# Patient Record
Sex: Female | Born: 1965
Health system: Southern US, Community
[De-identification: ages and names within clinical notes are randomized; demographics above are authoritative.]

## PROBLEM LIST (undated history)

## (undated) DIAGNOSIS — D241 Benign neoplasm of right breast: Secondary | ICD-10-CM

## (undated) DIAGNOSIS — B019 Varicella without complication: Secondary | ICD-10-CM

## (undated) DIAGNOSIS — K219 Gastro-esophageal reflux disease without esophagitis: Secondary | ICD-10-CM

## (undated) DIAGNOSIS — D649 Anemia, unspecified: Secondary | ICD-10-CM

## (undated) DIAGNOSIS — T7840XA Allergy, unspecified, initial encounter: Secondary | ICD-10-CM

## (undated) DIAGNOSIS — R7611 Nonspecific reaction to tuberculin skin test without active tuberculosis: Secondary | ICD-10-CM

## (undated) HISTORY — DX: Nonspecific reaction to tuberculin skin test without active tuberculosis: R76.11

## (undated) HISTORY — DX: Varicella without complication: B01.9

## (undated) HISTORY — DX: Anemia, unspecified: D64.9

## (undated) HISTORY — DX: Benign neoplasm of right breast: D24.1

## (undated) HISTORY — DX: Gastro-esophageal reflux disease without esophagitis: K21.9

## (undated) HISTORY — PX: WISDOM TOOTH EXTRACTION: SHX21

## (undated) HISTORY — DX: Allergy, unspecified, initial encounter: T78.40XA

---

## 1990-05-26 HISTORY — PX: CHOLECYSTECTOMY: SHX55

## 1998-08-22 ENCOUNTER — Other Ambulatory Visit: Admission: RE | Admit: 1998-08-22 | Discharge: 1998-08-22 | Payer: Self-pay | Admitting: Obstetrics and Gynecology

## 1999-08-27 ENCOUNTER — Other Ambulatory Visit: Admission: RE | Admit: 1999-08-27 | Discharge: 1999-08-27 | Payer: Self-pay | Admitting: Obstetrics and Gynecology

## 2000-09-22 ENCOUNTER — Other Ambulatory Visit: Admission: RE | Admit: 2000-09-22 | Discharge: 2000-09-22 | Payer: Self-pay | Admitting: Obstetrics and Gynecology

## 2005-12-16 ENCOUNTER — Other Ambulatory Visit: Admission: RE | Admit: 2005-12-16 | Discharge: 2005-12-16 | Payer: Self-pay | Admitting: Obstetrics and Gynecology

## 2008-11-14 ENCOUNTER — Encounter: Admission: RE | Admit: 2008-11-14 | Discharge: 2008-11-14 | Payer: Self-pay | Admitting: Internal Medicine

## 2008-11-17 ENCOUNTER — Encounter: Admission: RE | Admit: 2008-11-17 | Discharge: 2008-11-17 | Payer: Self-pay | Admitting: Internal Medicine

## 2010-02-27 ENCOUNTER — Encounter: Admission: RE | Admit: 2010-02-27 | Discharge: 2010-02-27 | Payer: Self-pay | Admitting: Obstetrics and Gynecology

## 2011-07-10 ENCOUNTER — Other Ambulatory Visit: Payer: Self-pay | Admitting: Obstetrics and Gynecology

## 2011-07-10 DIAGNOSIS — Z1231 Encounter for screening mammogram for malignant neoplasm of breast: Secondary | ICD-10-CM

## 2011-07-15 ENCOUNTER — Ambulatory Visit: Payer: Self-pay

## 2011-08-01 ENCOUNTER — Ambulatory Visit: Payer: Self-pay | Admitting: Obstetrics and Gynecology

## 2011-08-12 ENCOUNTER — Ambulatory Visit
Admission: RE | Admit: 2011-08-12 | Discharge: 2011-08-12 | Disposition: A | Discharge status: Home / Self Care | Payer: BC Managed Care – PPO | Source: Ambulatory Visit | Attending: Obstetrics and Gynecology | Admitting: Obstetrics and Gynecology

## 2011-08-12 DIAGNOSIS — Z1231 Encounter for screening mammogram for malignant neoplasm of breast: Secondary | ICD-10-CM

## 2011-08-13 ENCOUNTER — Other Ambulatory Visit: Payer: Self-pay | Admitting: Obstetrics and Gynecology

## 2011-08-13 DIAGNOSIS — R928 Other abnormal and inconclusive findings on diagnostic imaging of breast: Secondary | ICD-10-CM

## 2011-08-25 DIAGNOSIS — D241 Benign neoplasm of right breast: Secondary | ICD-10-CM

## 2011-08-25 HISTORY — PX: BREAST BIOPSY: SHX20

## 2011-08-25 HISTORY — DX: Benign neoplasm of right breast: D24.1

## 2011-09-08 ENCOUNTER — Ambulatory Visit
Admission: RE | Admit: 2011-09-08 | Discharge: 2011-09-08 | Disposition: A | Discharge status: Home / Self Care | Payer: BC Managed Care – PPO | Source: Ambulatory Visit | Attending: Obstetrics and Gynecology | Admitting: Obstetrics and Gynecology

## 2011-09-08 ENCOUNTER — Other Ambulatory Visit: Payer: Self-pay | Admitting: Obstetrics and Gynecology

## 2011-09-08 DIAGNOSIS — R928 Other abnormal and inconclusive findings on diagnostic imaging of breast: Secondary | ICD-10-CM

## 2011-09-17 ENCOUNTER — Ambulatory Visit
Admission: RE | Admit: 2011-09-17 | Discharge: 2011-09-17 | Disposition: A | Discharge status: Home / Self Care | Payer: BC Managed Care – PPO | Source: Ambulatory Visit | Attending: Obstetrics and Gynecology | Admitting: Obstetrics and Gynecology

## 2011-09-17 DIAGNOSIS — R928 Other abnormal and inconclusive findings on diagnostic imaging of breast: Secondary | ICD-10-CM

## 2011-09-19 ENCOUNTER — Ambulatory Visit: Payer: Self-pay | Admitting: Obstetrics and Gynecology

## 2011-09-23 ENCOUNTER — Ambulatory Visit: Payer: Self-pay | Admitting: Obstetrics and Gynecology

## 2011-10-10 ENCOUNTER — Encounter: Payer: Self-pay | Admitting: Obstetrics and Gynecology

## 2011-10-10 ENCOUNTER — Ambulatory Visit: Payer: Self-pay | Admitting: Obstetrics and Gynecology

## 2011-10-10 ENCOUNTER — Ambulatory Visit (INDEPENDENT_AMBULATORY_CARE_PROVIDER_SITE_OTHER): Payer: BC Managed Care – PPO | Admitting: Obstetrics and Gynecology

## 2011-10-10 VITALS — BP 120/82 | Temp 98.3°F | Ht 64.0 in | Wt 194.0 lb

## 2011-10-10 DIAGNOSIS — Z9049 Acquired absence of other specified parts of digestive tract: Secondary | ICD-10-CM | POA: Insufficient documentation

## 2011-10-10 DIAGNOSIS — Z01419 Encounter for gynecological examination (general) (routine) without abnormal findings: Secondary | ICD-10-CM

## 2011-10-10 DIAGNOSIS — D242 Benign neoplasm of left breast: Secondary | ICD-10-CM | POA: Insufficient documentation

## 2011-10-10 DIAGNOSIS — Z124 Encounter for screening for malignant neoplasm of cervix: Secondary | ICD-10-CM

## 2011-10-10 DIAGNOSIS — Z9089 Acquired absence of other organs: Secondary | ICD-10-CM

## 2011-10-10 DIAGNOSIS — N6029 Fibroadenosis of unspecified breast: Secondary | ICD-10-CM

## 2011-10-10 NOTE — Progress Notes (Signed)
Subjective:    Sara Mercer is a 46 y.o. female, G1P0010, who presents for an annual exam. The patient reports no complaints. 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  Menstrual cycle:   LMP: Patient's last menstrual period was 10/02/2011. flow 3 days pad change  3/day mild cramping managed by Ibuprofen             Review of Systems Pertinent items are noted in HPI. Denies pelvic pain, uti symptoms, vaginitis symptoms, irregular bleeding, menopausal symptoms, change in bowel habits or rectal bleeding   Objective:    BP 120/82  Temp(Src) 98.3 F (36.8 C) (Oral)  Ht 5\' 4"  (1.626 m)  Wt 194 lb (87.998 kg)  BMI 33.30 kg/m2  LMP 10/02/2011 Wt Readings from Last 1 Encounters:  10/10/11 194 lb (87.998 kg)   Body mass index is 33.30 kg/(m^2).  General Appearance: Alert, appropriate appearance for age. No acute distress HEENT: Grossly normal Neck / Thyroid: Supple, no thyromegaly or cervical adenopathy Lungs: clear to auscultation bilaterally Back: No CVA tenderness Breast Exam: No masses or nodes.No dimpling, nipple retraction or discharge. Cardiovascular: Regular rate and rhythm.  Gastrointestinal: Soft, non-tender, no masses or organomegaly Pelvic Exam: EGBUS-wnl, vagina-normal rugae, cervix- without lesions or tenderness, uterus appears normal size shape and consistency, adnexae-no masses or tenderness Lymphatic Exam: Non-palpable nodes in neck, clavicular,  axillary, or inguinal regions  Skin: no rashes or abnormalities Extremities: no clubbing cyanosis or edema  Neurologic: grossly normal Psychiatric: Alert and oriented     Assessment:   Routine GYN Exam Left Breast Fibroadenoma (new diagnosis)   Plan:    PAP sent RTO 1 year or prn  Ruthene Methvin,ELMIRAPA-C

## 2011-10-10 NOTE — Progress Notes (Signed)
Regular Periods: yes Mammogram: yes  Monthly Breast Ex.: no Exercise: no  Tetanus < 10 years: yes Seatbelts: yes  NI. Bladder Functn.: yes Abuse at home: no  Daily BM's: yes Stressful Work: yes  Healthy Diet: yes Sigmoid-Colonoscopy: no  Calcium: yes Medical problems this year: no problems   LAST PAP:2011  nl  Contraception: none  Mammogram:  08/2011 abnl.;bx done on right side;normal  PCP: DR. Andi Devon  PMH: NO CHANGE  FMH: NO CHANGE  Last Bone Scan: NO

## 2011-10-13 ENCOUNTER — Ambulatory Visit: Payer: Self-pay | Admitting: Obstetrics and Gynecology

## 2011-10-15 LAB — PAP IG W/ RFLX HPV ASCU

## 2013-03-25 ENCOUNTER — Other Ambulatory Visit: Payer: Self-pay

## 2013-03-25 DIAGNOSIS — Z1231 Encounter for screening mammogram for malignant neoplasm of breast: Secondary | ICD-10-CM

## 2013-05-02 ENCOUNTER — Ambulatory Visit
Admission: RE | Admit: 2013-05-02 | Discharge: 2013-05-02 | Disposition: A | Discharge status: Home / Self Care | Payer: 59 | Source: Ambulatory Visit

## 2013-05-02 DIAGNOSIS — Z1231 Encounter for screening mammogram for malignant neoplasm of breast: Secondary | ICD-10-CM

## 2014-03-27 ENCOUNTER — Encounter: Payer: Self-pay | Admitting: Obstetrics and Gynecology

## 2014-05-08 ENCOUNTER — Other Ambulatory Visit: Payer: Self-pay

## 2014-05-08 DIAGNOSIS — Z1231 Encounter for screening mammogram for malignant neoplasm of breast: Secondary | ICD-10-CM

## 2014-05-16 ENCOUNTER — Ambulatory Visit
Admission: RE | Admit: 2014-05-16 | Discharge: 2014-05-16 | Disposition: A | Discharge status: Home / Self Care | Payer: 59 | Source: Ambulatory Visit

## 2014-05-16 DIAGNOSIS — Z1231 Encounter for screening mammogram for malignant neoplasm of breast: Secondary | ICD-10-CM

## 2014-10-23 ENCOUNTER — Ambulatory Visit: Payer: 59

## 2014-10-23 ENCOUNTER — Encounter: Payer: Self-pay | Admitting: Emergency Medicine

## 2014-10-23 ENCOUNTER — Ambulatory Visit
Admission: EM | Admit: 2014-10-23 | Discharge: 2014-10-23 | Disposition: A | Discharge status: Home / Self Care | Payer: 59 | Attending: Family Medicine | Admitting: Family Medicine

## 2014-10-23 DIAGNOSIS — Z79899 Other long term (current) drug therapy: Secondary | ICD-10-CM | POA: Diagnosis not present

## 2014-10-23 DIAGNOSIS — D649 Anemia, unspecified: Secondary | ICD-10-CM | POA: Insufficient documentation

## 2014-10-23 DIAGNOSIS — S93432A Sprain of tibiofibular ligament of left ankle, initial encounter: Secondary | ICD-10-CM

## 2014-10-23 DIAGNOSIS — S93492A Sprain of other ligament of left ankle, initial encounter: Secondary | ICD-10-CM

## 2014-10-23 DIAGNOSIS — Z7982 Long term (current) use of aspirin: Secondary | ICD-10-CM | POA: Insufficient documentation

## 2014-10-23 DIAGNOSIS — Z9049 Acquired absence of other specified parts of digestive tract: Secondary | ICD-10-CM | POA: Insufficient documentation

## 2014-10-23 DIAGNOSIS — M25572 Pain in left ankle and joints of left foot: Secondary | ICD-10-CM | POA: Insufficient documentation

## 2014-10-23 MED ORDER — MELOXICAM 15 MG PO TABS: 15.0000 mg | ORAL_TABLET | Freq: Every day | ORAL | Status: DC

## 2014-10-23 NOTE — ED Notes (Signed)
Patient states that she tripped and fell down her garage stairs 4 weeks ago and is continues to have pain and swelling in her left ankle.

## 2014-10-23 NOTE — ED Provider Notes (Signed)
CSN: [id number]     Arrival date & time 10/23/14  1032 History   First MD Initiated Contact with Patient 10/23/14 1200     Chief Complaint  Patient presents with  . Ankle Pain    left   (Consider location/radiation/quality/duration/timing/severity/associated sxs/prior Treatment) Patient is a 49 y.o. female presenting with ankle pain. The history is provided by the patient. No language interpreter was used.  Ankle Pain Location:  Ankle Time since incident:  2 weeks Injury: yes   Mechanism of injury: fall   Fall:    Fall occurred:  Down stairs   Impact surface:  Unable to specify Ankle location:  L ankle Pain details:    Quality:  Aching and throbbing   Radiates to:  Does not radiate   Duration:  2 weeks   Progression:  Waxing and waning Chronicity:  New Dislocation: no   Foreign body present:  No foreign bodies Prior injury to area:  No Relieved by:  Nothing Worsened by:  Activity Ineffective treatments:  NSAIDs Risk factors: no concern for non-accidental trauma, no frequent fractures, no known bone disorder, no obesity and no recent illness     Past Medical History  Diagnosis Date  . Anemia   . Thalassemia minor   . 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 Cholecystectomy  1992  . Wisdom tooth extraction    . Breast biopsy  08/2011    negative results   Family History  Problem Relation Age of Onset  . Hypertension Maternal Grandmother   . Heart disease Maternal Grandmother     heart failure  . Glaucoma Maternal Grandfather   . Diabetes Maternal Grandfather   . Hypertension Maternal Grandfather   . Hypertension Father   . Diabetes Mother   . Glaucoma Mother    History  Substance Use Topics  . Smoking status: Never Smoker   . Smokeless tobacco: Never Used  . Alcohol Use: No   OB History    Gravida Para Term Preterm AB TAB SAB Ectopic Multiple Living   1 0   1  1   0     Review of  Systems  All other systems reviewed and are negative.   Allergies  Review of patient's allergies indicates no known allergies.  Home Medications   Prior to Admission medications   Medication Sig Start Date End Date Taking? Authorizing Provider  aspirin 81 MG tablet Take 81 mg by mouth daily.    Historical Provider, MD  Biotin 10 MG TABS Take by mouth.    Historical Provider, MD  calcium carbonate 200 MG capsule Take 250 mg by mouth 2 (two) times daily with a meal.    Historical Provider, MD  cholecalciferol (VITAMIN D) 1000 UNITS tablet Take 1,000 Units by mouth daily.    Historical Provider, MD  Famotidine (PEPCID PO) Take by mouth.    Historical Provider, MD  fexofenadine (ALLEGRA) 180 MG tablet Take 180 mg by mouth daily.    Historical Provider, MD  fish oil-omega-3 fatty acids 1000 MG capsule Take 2 g by mouth daily.    Historical Provider, MD  fluticasone (FLONASE) 50 MCG/ACT nasal spray Place 2 sprays into the nose daily.    Historical Provider, MD  Multiple Vitamin (MULTIVITAMIN) tablet Take 1 tablet by mouth daily.    Historical Provider, MD  niacin 100 MG tablet Take 100 mg by mouth daily with breakfast.  Historical Provider, MD  vitamin E 100 UNIT capsule Take 100 Units by mouth daily.    Historical Provider, MD   BP 116/60 mmHg  Pulse 58  Temp(Src) 97.8 F (36.6 C) (Tympanic)  Resp 16  Ht 5\' 4"  (1.626 m)  Wt 200 lb (90.719 kg)  BMI 34.31 kg/m2  SpO2 100%  LMP 10/22/2014 (Exact Date) Physical Exam  Constitutional: She appears well-developed and well-nourished. No distress.  HENT:  Head: Normocephalic.  Musculoskeletal: She exhibits edema and tenderness.       Left foot: There is decreased range of motion, tenderness and swelling.       Feet:  Neurological: She is alert.  Skin: Skin is warm and dry. She is not diaphoretic.  Psychiatric: She has a normal mood and affect.    ED Course  Procedures (including critical care time) Labs Review Labs Reviewed - No  data to display  Imaging Review Dg Ankle Complete Left  10/23/2014   CLINICAL DATA:  Week old injury.  Persistent left ankle pain.  EXAM: LEFT ANKLE COMPLETE - 3+ VIEW  COMPARISON:  None.  FINDINGS: No fracture. Ankle mortise normally spaced and aligned. No arthropathic change. There is no bone lesion.  Mild soft tissue swelling is noted diffusely.  IMPRESSION: 1. No fracture or ankle joint abnormality. Mild nonspecific soft tissue edema. Given the persistence of left ankle pain, significant ligamentous injury should be considered which would be best evaluated with left ankle MRI.   Electronically Signed   By: Lajean Manes M.D.   On: 10/23/2014 12:19   I discussed with patient that we will try her with a boot for about 2 weeks. If things are not improved at that time she will probably need to see orthopedic for possible MRI. Also said Motrin will try Mobic 15 mg 1 tablet a day.   MDM  No diagnosis found.     Frederich Cha, MD 10/25/14 [phone]

## 2014-10-23 NOTE — Discharge Instructions (Signed)

## 2015-04-24 ENCOUNTER — Other Ambulatory Visit: Payer: Self-pay

## 2015-04-24 DIAGNOSIS — Z1231 Encounter for screening mammogram for malignant neoplasm of breast: Secondary | ICD-10-CM

## 2015-05-08 ENCOUNTER — Encounter: Payer: Self-pay | Admitting: Sports Medicine

## 2015-05-08 ENCOUNTER — Ambulatory Visit (INDEPENDENT_AMBULATORY_CARE_PROVIDER_SITE_OTHER): Payer: 59 | Admitting: Sports Medicine

## 2015-05-08 DIAGNOSIS — M2040 Other hammer toe(s) (acquired), unspecified foot: Secondary | ICD-10-CM | POA: Diagnosis not present

## 2015-05-08 DIAGNOSIS — B351 Tinea unguium: Secondary | ICD-10-CM | POA: Diagnosis not present

## 2015-05-08 DIAGNOSIS — M79676 Pain in unspecified toe(s): Secondary | ICD-10-CM

## 2015-05-08 DIAGNOSIS — L84 Corns and callosities: Secondary | ICD-10-CM

## 2015-05-08 NOTE — Patient Instructions (Signed)
Hammer Toes Hammer toes is a condition in which one or more of your toes is permanently flexed. CAUSES  This happens when a muscle imbalance or abnormal bone length makes your small toes buckle. This causes the toe joint to contract and the strong cord-like bands that attach muscles to the bones (tendons) in your toes to shorten.  SIGNS AND SYMPTOMS  Common symptoms of flexible hammer toes include:   A buildup of skin cells (corns). Corns occur where boney bumps come in frequent contact with hard surfaces. For example, where your shoes press and rub.  Irritation.  Inflammation.  Pain.  Limited motion in your toes. DIAGNOSIS  Hammer toes are diagnosed through a physical exam of your toes. During the exam, your health care provider may try to reproduce your symptoms by manipulating your foot. Often, X-ray exams are done to determine the degree of deformity and to make sure that the cause is not a fracture.  TREATMENT  Hammer toes can be treated with corrective surgery. There are several types of surgical procedures that can treat hammer toes. The most common procedures include:  Arthroplasty--A portion of the joint is surgically removed and your toe is straightened. The gap fills in with fibrous tissue. This procedure helps treat pain and deformity and helps restore function.  Fusion--Cartilage between the two bones of the affected joint is taken out and the bones fuse together into one longer bone. This helps keep your toe stable and reduces pain but leaves your toe stiff, yet straight.  Implantation--A portion of your bone is removed and replaced with an implant to restore motion.  Flexor tendon transfers--This procedure repositions the tendons that curl the toes down (flexor tendons). This may be done to release the deforming force that causes your toe to buckle. Several of these procedures require fixing your toe with a pin that is visible at the tip of your toe. The pin keeps the toe  straight during healing. Your health care provider will remove the pin usually within 4-8 weeks after the procedure.    This information is not intended to replace advice given to you by your health care provider. Make sure you discuss any questions you have with your health care provider.   Document Released: 05/09/2000 Document Revised: 05/17/2013 Document Reviewed: 01/17/2013 Elsevier Interactive Patient Education 2016 [employer]  

## 2015-05-08 NOTE — Progress Notes (Signed)
Patient ID: [name], female   DOB: 1965/10/26, 49 y.o.   MRN: [id number] Subjective: Sara Mercer is a 49 y.o. female patient seen today in office with complaint of painful callus in between her right 4-5 toes; states that it has been there for >6 months. Admits that it does not hurt however wanted to get it checked out because her mom scared her into thinking that it could be cancer. Patient has not tried anything to treat it. Patient is also concerned about discoloration to all toe nails; states that several of her family members have nails that look this way; admits that her uncles took oral Lamisil with resolution of the changes in their nails. Patient denies history of Diabetes, Neuropathy, or Vascular disease. Patient has no other pedal complaints at this time.   Patient Active Problem List   Diagnosis Date Noted  . Fibroadenoma of breast, left 10/10/2011  . S/P cholecystectomy 10/10/2011   Current Outpatient Prescriptions on File Prior to Visit  Medication Sig Dispense Refill  . Biotin 10 MG TABS Take by mouth.    . calcium carbonate 200 MG capsule Take 250 mg by mouth 2 (two) times daily with a meal.    . cholecalciferol (VITAMIN D) 1000 UNITS tablet Take 1,000 Units by mouth daily.    . Famotidine (PEPCID PO) Take by mouth.    . fexofenadine (ALLEGRA) 180 MG tablet Take 180 mg by mouth daily.    . fluticasone (FLONASE) 50 MCG/ACT nasal spray Place 2 sprays into the nose daily.    . Multiple Vitamin (MULTIVITAMIN) tablet Take 1 tablet by mouth daily.    . vitamin E 100 UNIT capsule Take 100 Units by mouth daily.    . meloxicam (MOBIC) 15 MG tablet Take 1 tablet (15 mg total) by mouth daily. (Patient not taking: Reported on 05/08/2015) 30 tablet 1   No current facility-administered medications on file prior to visit.   No Known Allergies     Objective: Physical Exam  General: Well developed, nourished, no acute distress, awake, alert and oriented x 3  Vascular:  Dorsalis pedis artery 2/4 bilateral, Posterior tibial artery 2/4 bilateral, skin temperature warm to warm proximal to distal bilateral lower extremities, no varicosities, pedal hair present bilateral.  Neurological: Gross sensation present via light touch bilateral.   Dermatological: Skin is warm, dry, and supple bilateral, Nails 1-10 are short thick, and discolored with mild subungal debris, no webspace macerations present bilateral, no open lesions present bilateral, + callus/hyperkeratotic tissue present right 5th toe medial aspect at webspace. No signs of infection bilateral.  Musculoskeletal: Mild asymptomatic bunion and hammertoe deformities noted bilateral. Muscular strength within normal limits without pain or limitation on range of motion. No pain with calf compression bilateral.  Assessment and Plan:  Problem List Items Addressed This Visit    None    Visit Diagnoses    Pain of toe, unspecified laterality    -  Primary    Relevant Orders    Fungus Culture with Smear    Corn or callus        Relevant Orders    Fungus Culture with Smear    Hammertoe, unspecified laterality        Relevant Orders    Fungus Culture with Smear    Dermatophytosis of nail        Relevant Orders    Fungus Culture with Smear       -Examined patient.  -Discussed treatment options for dystrophic nails  with possible fungus and interdigital callus. -Mechanically debrided callus at 4th interspace on right foot with a sterile chisel blade without incident; explained etiology and reoccurence rate secondary to hammertoe deformity. Dispensed padding and advised patient close monitoring and to consider hammertoe repair if persists. -Fungal culture obtained for all nails and sent to [facility] lab -Patient to return in 4 weeks  for follow up evaluation/discuss culture results or sooner if symptoms worsen.  Landis Martins, DPM

## 2015-05-09 ENCOUNTER — Telehealth: Payer: Self-pay | Admitting: *Deleted

## 2015-05-09 NOTE — Telephone Encounter (Signed)
Pt states she saw Dr. Cannon Kettle yesterday and was given a gel toe cushion, and she has lost it.  Pt request few to be left at the front desk for her to pick up tomorrow morning.

## 2015-05-30 ENCOUNTER — Ambulatory Visit
Admission: RE | Admit: 2015-05-30 | Discharge: 2015-05-30 | Disposition: A | Discharge status: Home / Self Care | Payer: 59 | Source: Ambulatory Visit

## 2015-05-30 ENCOUNTER — Ambulatory Visit: Payer: 59

## 2015-05-30 DIAGNOSIS — Z1231 Encounter for screening mammogram for malignant neoplasm of breast: Secondary | ICD-10-CM | POA: Diagnosis not present

## 2015-06-01 ENCOUNTER — Encounter: Payer: Self-pay | Admitting: Sports Medicine

## 2015-06-04 ENCOUNTER — Ambulatory Visit: Payer: 59 | Admitting: Internal Medicine

## 2015-06-12 ENCOUNTER — Ambulatory Visit (INDEPENDENT_AMBULATORY_CARE_PROVIDER_SITE_OTHER): Payer: 59 | Admitting: Sports Medicine

## 2015-06-12 VITALS — BP 108/54 | HR 66 | Resp 18

## 2015-06-12 DIAGNOSIS — M79676 Pain in unspecified toe(s): Secondary | ICD-10-CM | POA: Diagnosis not present

## 2015-06-12 DIAGNOSIS — L84 Corns and callosities: Secondary | ICD-10-CM | POA: Diagnosis not present

## 2015-06-12 DIAGNOSIS — M2040 Other hammer toe(s) (acquired), unspecified foot: Secondary | ICD-10-CM | POA: Diagnosis not present

## 2015-06-12 DIAGNOSIS — L603 Nail dystrophy: Secondary | ICD-10-CM | POA: Diagnosis not present

## 2015-06-12 NOTE — Progress Notes (Signed)
Patient ID: Sara Mercer, female   DOB: 08/20/1965, 50 y.o.   MRN: [id number]  Subjective: [place] Sara Mercer is a 50 y.o. female patient seen today in office for follow up evaluation of painful callus in between her right 4-5 toes and for discussion of nail culture results; states that the corn is less painful than before; states that the cushions seem to help. Patient has no other pedal complaints at this time.   Patient Active Problem List   Diagnosis Date Noted  . Fibroadenoma of breast, left 10/10/2011  . S/P cholecystectomy 10/10/2011   Current Outpatient Prescriptions on File Prior to Visit  Medication Sig Dispense Refill  . Biotin 10 MG TABS Take by mouth.    . calcium carbonate 200 MG capsule Take 250 mg by mouth 2 (two) times daily with a meal.    . cholecalciferol (VITAMIN D) 1000 UNITS tablet Take 1,000 Units by mouth daily.    . Famotidine (PEPCID PO) Take by mouth.    . fexofenadine (ALLEGRA) 180 MG tablet Take 180 mg by mouth daily.    . fluticasone (FLONASE) 50 MCG/ACT nasal spray Place 2 sprays into the nose daily.    . meloxicam (MOBIC) 15 MG tablet Take 1 tablet (15 mg total) by mouth daily. (Patient not taking: Reported on 05/08/2015) 30 tablet 1  . Multiple Vitamin (MULTIVITAMIN) tablet Take 1 tablet by mouth daily.    . vitamin E 100 UNIT capsule Take 100 Units by mouth daily.     No current facility-administered medications on file prior to visit.   No Known Allergies    Objective: Physical Exam  General: Well developed, nourished, no acute distress, awake, alert and oriented x 3  Vascular: Dorsalis pedis artery 2/4 bilateral, Posterior tibial artery 2/4 bilateral, skin temperature warm to warm proximal to distal bilateral lower extremities, no varicosities, pedal hair present bilateral.  Neurological: Gross sensation present via light touch bilateral.   Dermatological: Skin is warm, dry, and supple bilateral, Nails 1-10 are short thick, and discolored with  mild subungal debris, no webspace macerations present bilateral, no open lesions present bilateral, + callus/hyperkeratotic tissue present right 5th toe medial aspect at webspace. No signs of infection bilateral.  Musculoskeletal: Mild asymptomatic bunion and hammertoe deformities noted bilateral. Muscular strength within normal limits without pain or limitation on range of motion. No pain with calf compression bilateral.  Assessment and Plan:  Problem List Items Addressed This Visit    None    Visit Diagnoses    Pain of toe, unspecified laterality    -  Primary    Corn or callus        Hammertoe, unspecified laterality        Nail dystrophy        Negative fungal culture; Micro-trauma        -Examined patient.  -Reviewed nail culture results with patient; Negative for fungus. Will continue to monitor nails for any acute changes. Encouraged good hygiene and proper shoes to prevent microtrauma to nails -Mechanically debrided callus at 4th interspace on right foot with a sterile chisel blade without incident; re-educated patient on etiology and reoccurence rate secondary to hammertoe deformity. Dispensed padding and advised patient close monitoring and to consider hammertoe repair if persists. -Patient to return as needed or sooner if symptoms worsen.  Sara Mercer, DPM

## 2015-07-02 ENCOUNTER — Ambulatory Visit: Payer: 59 | Admitting: Internal Medicine

## 2015-07-25 ENCOUNTER — Emergency Department (HOSPITAL_COMMUNITY)
Admission: EM | Admit: 2015-07-25 | Discharge: 2015-07-25 | Disposition: A | Discharge status: Home / Self Care | Payer: 59 | Source: Home / Self Care | Attending: Family Medicine | Admitting: Family Medicine

## 2015-07-25 ENCOUNTER — Encounter (HOSPITAL_COMMUNITY): Payer: Self-pay | Admitting: *Deleted

## 2015-07-25 DIAGNOSIS — S80812A Abrasion, left lower leg, initial encounter: Secondary | ICD-10-CM | POA: Diagnosis not present

## 2015-07-25 DIAGNOSIS — W5503XA Scratched by cat, initial encounter: Secondary | ICD-10-CM

## 2015-07-25 MED ORDER — BACITRACIN ZINC 500 UNIT/GM EX OINT
TOPICAL_OINTMENT | CUTANEOUS | Status: AC
  Filled 2015-07-25: qty 1.8

## 2015-07-25 NOTE — ED Provider Notes (Signed)
CSN: [id number]     Arrival date & time 07/25/15  1301 History   First MD Initiated Contact with Patient 07/25/15 1321     Chief Complaint  Patient presents with  . Animal Bite   (Consider location/radiation/quality/duration/timing/severity/associated sxs/prior Treatment) Patient is a 50 y.o. female presenting with leg pain. The history is provided by the patient.  Leg Pain Location:  Leg Injury: yes   Mechanism of injury comment:  Cat scratch injury  to leg. Leg location:  L lower leg Pain details:    Quality:  Sharp   Severity:  No pain Chronicity:  New Dislocation: no   Foreign body present:  No foreign bodies Tetanus status:  Up to date Relieved by: local antiseptic wound care used promtly by pt. Associated symptoms: no fever, no stiffness and no swelling     Past Medical History  Diagnosis Date  . Anemia   . Thalassemia minor   . 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 Cholecystectomy  1992  . Wisdom tooth extraction    . Breast biopsy  08/2011    negative results   Family History  Problem Relation Age of Onset  . Hypertension Maternal Grandmother   . Heart disease Maternal Grandmother     heart failure  . Glaucoma Maternal Grandfather   . Diabetes Maternal Grandfather   . Hypertension Maternal Grandfather   . Hypertension Father   . Diabetes Mother   . Glaucoma Mother    Social History  Substance Use Topics  . Smoking status: Never Smoker   . Smokeless tobacco: Never Used  . Alcohol Use: No   OB History    Gravida Para Term Preterm AB TAB SAB Ectopic Multiple Living   1 0   1  1   0     Review of Systems  Constitutional: Negative for fever.  Musculoskeletal: Negative.  Negative for stiffness.  Skin: Positive for wound.    Allergies  Review of patient's allergies indicates no known allergies.  Home Medications   Prior to Admission medications   Medication Sig Start Date End  Date Taking? Authorizing Provider  Biotin 10 MG TABS Take by mouth.    Historical Provider, MD  calcium carbonate 200 MG capsule Take 250 mg by mouth 2 (two) times daily with a meal.    Historical Provider, MD  cholecalciferol (VITAMIN D) 1000 UNITS tablet Take 1,000 Units by mouth daily.    Historical Provider, MD  Famotidine (PEPCID PO) Take by mouth.    Historical Provider, MD  fexofenadine (ALLEGRA) 180 MG tablet Take 180 mg by mouth daily.    Historical Provider, MD  fluticasone (FLONASE) 50 MCG/ACT nasal spray Place 2 sprays into the nose daily.    Historical Provider, MD  meloxicam (MOBIC) 15 MG tablet Take 1 tablet (15 mg total) by mouth daily. Patient not taking: Reported on 05/08/2015 10/23/14   Frederich Cha, MD  Multiple Vitamin (MULTIVITAMIN) tablet Take 1 tablet by mouth daily.    Historical Provider, MD  vitamin E 100 UNIT capsule Take 100 Units by mouth daily.    Historical Provider, MD   Meds Ordered and Administered this Visit  Medications - No data to display  BP 133/83 mmHg  Pulse 65  Temp(Src) 98.4 F (36.9 C) (Oral)  Resp 18  SpO2 99% No data found.   Physical Exam  Constitutional: She is oriented to person, place, and time.  She appears well-developed and well-nourished. No distress.  Musculoskeletal: Normal range of motion. She exhibits no tenderness.  Neurological: She is alert and oriented to person, place, and time.  Skin: Skin is warm and dry.  4 minor scratch, nonpuncture wound marks to left post calf, nontender, no erythema, no drainage.  Nursing note and vitals reviewed.   ED Course  Procedures (including critical care time)  Labs Review Labs Reviewed - No data to display  Imaging Review No results found.   Visual Acuity Review  Right Eye Distance:   Left Eye Distance:   Bilateral Distance:    Right Eye Near:   Left Eye Near:    Bilateral Near:         MDM   1. Cat scratch of left lower leg, initial encounter        Billy Fischer, MD 07/29/15 1318

## 2015-07-25 NOTE — ED Notes (Signed)
Pt  Sustained  A  Superficial        Cat    Scratch  Possible  Bite  To  l  Lower  Leg  Yesterday    The  Patient  Cleaned  The  Affected  Area           Pt      Reports  Up  To  Date  On  Her  Tetanus  Immunization

## 2015-07-25 NOTE — Discharge Instructions (Signed)
Wash vigorously twice a day until healed. Return as needed.

## 2015-07-30 ENCOUNTER — Ambulatory Visit (INDEPENDENT_AMBULATORY_CARE_PROVIDER_SITE_OTHER): Payer: 59 | Admitting: Internal Medicine

## 2015-07-30 ENCOUNTER — Encounter: Payer: Self-pay | Admitting: Internal Medicine

## 2015-07-30 VITALS — BP 126/80 | HR 52 | Temp 98.7°F | Ht 64.0 in | Wt 196.0 lb

## 2015-07-30 DIAGNOSIS — Z833 Family history of diabetes mellitus: Secondary | ICD-10-CM

## 2015-07-30 DIAGNOSIS — D563 Thalassemia minor: Secondary | ICD-10-CM | POA: Diagnosis not present

## 2015-07-30 DIAGNOSIS — K219 Gastro-esophageal reflux disease without esophagitis: Secondary | ICD-10-CM | POA: Diagnosis not present

## 2015-07-30 DIAGNOSIS — J302 Other seasonal allergic rhinitis: Secondary | ICD-10-CM | POA: Insufficient documentation

## 2015-07-30 LAB — CBC WITH DIFFERENTIAL/PLATELET
Basophils Absolute: 0 10*3/uL (ref 0.0–0.1)
Basophils Relative: 0.3 % (ref 0.0–3.0)
EOS ABS: 0.1 10*3/uL (ref 0.0–0.7)
EOS PCT: 0.8 % (ref 0.0–5.0)
HCT: 35.5 % — ABNORMAL LOW (ref 36.0–46.0)
HEMOGLOBIN: 11.4 g/dL — AB (ref 12.0–15.0)
Lymphocytes Relative: 31.6 % (ref 12.0–46.0)
Lymphs Abs: 2.2 10*3/uL (ref 0.7–4.0)
MCHC: 32.1 g/dL (ref 30.0–36.0)
MCV: 71.2 fl — ABNORMAL LOW (ref 78.0–100.0)
MONO ABS: 0.6 10*3/uL (ref 0.1–1.0)
Monocytes Relative: 8.2 % (ref 3.0–12.0)
Neutro Abs: 4.1 10*3/uL (ref 1.4–7.7)
Neutrophils Relative %: 59.1 % (ref 43.0–77.0)
Platelets: 292 10*3/uL (ref 150.0–400.0)
RBC: 4.98 Mil/uL (ref 3.87–5.11)
RDW: 16 % — ABNORMAL HIGH (ref 11.5–15.5)
WBC: 6.9 10*3/uL (ref 4.0–10.5)

## 2015-07-30 LAB — COMPREHENSIVE METABOLIC PANEL
ALBUMIN: 4.2 g/dL (ref 3.5–5.2)
ALK PHOS: 38 U/L — AB (ref 39–117)
ALT: 19 U/L (ref 0–35)
AST: 23 U/L (ref 0–37)
BILIRUBIN TOTAL: 0.4 mg/dL (ref 0.2–1.2)
BUN: 11 mg/dL (ref 6–23)
CO2: 27 mEq/L (ref 19–32)
CREATININE: 0.82 mg/dL (ref 0.40–1.20)
Calcium: 9.5 mg/dL (ref 8.4–10.5)
Chloride: 104 mEq/L (ref 96–112)
GFR: 95.45 mL/min (ref >60.00)
Glucose, Bld: 77 mg/dL (ref 70–99)
Potassium: 4.1 mEq/L (ref 3.5–5.1)
SODIUM: 137 meq/L (ref 135–145)
TOTAL PROTEIN: 7.1 g/dL (ref 6.0–8.3)

## 2015-07-30 LAB — LIPID PANEL
Cholesterol: 206 mg/dL — ABNORMAL HIGH (ref 0–200)
HDL: 61.2 mg/dL (ref >39.00)
LDL Cholesterol: 128 mg/dL — ABNORMAL HIGH (ref 0–99)
NonHDL: 145.04
Total CHOL/HDL Ratio: 3
Triglycerides: 86 mg/dL (ref 0.0–149.0)
VLDL: 17.2 mg/dL (ref 0.0–40.0)

## 2015-07-30 LAB — HEMOGLOBIN A1C: HEMOGLOBIN A1C: 6.2 % (ref 4.6–6.5)

## 2015-07-30 MED ORDER — FEXOFENADINE HCL 180 MG PO TABS: 180.0000 mg | ORAL_TABLET | Freq: Every day | ORAL | Status: DC

## 2015-07-30 MED ORDER — FLUTICASONE PROPIONATE 50 MCG/ACT NA SUSP: 2.0000 | Freq: Every day | NASAL | Status: DC

## 2015-07-30 NOTE — Progress Notes (Signed)
HPI  Pt presents to the clinic today to establish care and for management of the conditions listed below. She is transferring care from Dr. Karlton Lemon at [facility].   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 She reports she has not had her blood counts checked in the last 2 years.  Seasonal Allergies: Worse in the spring. She takes Advertising account planner as needed with good relief.  GERD: She can not identify her triggers. Occurs intermittently. Takes Prevacid as needed with good relief.  Flu: 01/2015 Tetanus: 10/2014 Mammogram: 05/2015 Pap Smear: 05/2015 Vision Screening: yearly Dentist: biannually  Past Medical History  Diagnosis Date  . Anemia   . Thalassemia minor   . 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 Chicken pox   . Allergy   . GERD (gastroesophageal reflux disease)   . Positive TB test     Current Outpatient Prescriptions  Medication Sig Dispense Refill  . b complex vitamins tablet Take 1 tablet by mouth daily.    . Biotin 1000 MCG tablet Take 1,000 mcg by mouth daily.    . Cholecalciferol (VITAMIN D) 2000 units tablet Take 2,000 Units by mouth daily.    . fexofenadine (ALLEGRA) 180 MG tablet Take 180 mg by mouth daily.    . fluticasone (FLONASE) 50 MCG/ACT nasal spray Place 2 sprays into the nose daily.    Marland Kitchen ibuprofen (ADVIL,MOTRIN) 200 MG tablet Take 800 mg by mouth as needed.    . lansoprazole (PREVACID) 15 MG capsule Take 15 mg by mouth daily at 12 noon.    . Multiple Vitamin (MULTIVITAMIN) tablet Take 1 tablet by mouth daily.    . vitamin E 400 UNIT capsule Take 400 Units by mouth daily.     No current facility-administered medications for this visit.    No Known Allergies  Family History  Problem Relation Age of Onset  . Hypertension Maternal Grandmother   . Heart disease Maternal Grandmother     heart failure  . Glaucoma Maternal Grandfather   . Diabetes Maternal Grandfather   . Hypertension Maternal Grandfather   . Hypertension  Father   . Stroke Father   . Diabetes Mother   . Glaucoma Mother     Social History   Social History  . Marital Status: Divorced    Spouse Name: N/A  . Number of Children: N/A  . Years of Education: N/A   Occupational History  . Not on file.   Social History Main Topics  . Smoking status: Never Smoker   . Smokeless tobacco: Never Used  . Alcohol Use: 0.0 oz/week    0 Standard drinks or equivalent per week     Comment: occasional  . Drug Use: No  . Sexual Activity: Not Currently    Birth Control/ Protection: None   Other Topics Concern  . Not on file   Social History Narrative    ROS:  Constitutional: Denies fever, malaise, fatigue, headache or abrupt weight changes.  HEENT: Denies eye pain, eye redness, ear pain, ringing in the ears, wax buildup, runny nose, nasal congestion, bloody nose, or sore throat. Respiratory: Denies difficulty breathing, shortness of breath, cough or sputum production.   Cardiovascular: Denies chest pain, chest tightness, palpitations or swelling in the hands or feet.  Gastrointestinal: Denies abdominal pain, bloating, constipation, diarrhea or blood in the stool.  GU: Denies frequency, urgency, pain with urination, blood in urine, odor or discharge. Musculoskeletal: Denies decrease in range of motion, difficulty with gait,  muscle pain or joint pain and swelling.  Skin: Denies redness, rashes, lesions or ulcercations.  Neurological: Denies dizziness, difficulty with memory, difficulty with speech or problems with balance and coordination.  Psych: Denies anxiety, depression, SI/HI.  No other specific complaints in a complete review of systems (except as listed in HPI above).  PE:  BP 126/80 mmHg  Pulse 52  Temp(Src) 98.7 F (37.1 C) (Oral)  Ht 5\' 4"  (1.626 m)  Wt 196 lb (88.905 kg)  BMI 33.63 kg/m2  SpO2 99%  LMP 07/26/2015  Wt Readings from Last 3 Encounters:  10/23/14 200 lb (90.719 kg)  10/10/11 194 lb (87.998 kg)    General:  Appears her stated age, obese in NAD. Skin: Dry and intact. Cardiovascular: Normal rate and rhythm. S1,S2 noted.  No murmur, rubs or gallops noted.  Pulmonary/Chest: Normal effort and positive vesicular breath sounds. No respiratory distress. No wheezes, rales or ronchi noted.  Neurological: Alert and oriented.  Psychiatric: Mood and affect normal. Behavior is normal. Judgment and thought content normal.    Assessment and Plan:  Family history of DM:  Will check A1C and Lipid Profile today  RTC in 1 year for annual exam

## 2015-07-30 NOTE — Assessment & Plan Note (Signed)
Encouraged weight loss Continue Prevacid prn

## 2015-07-30 NOTE — Patient Instructions (Signed)
Anemia, Nonspecific Anemia is a condition in which the concentration of red blood cells or hemoglobin in the blood is below normal. Hemoglobin is a substance in red blood cells that carries oxygen to the tissues of the body. Anemia results in not enough oxygen reaching these tissues.  CAUSES  Common causes of anemia include:   Excessive bleeding. Bleeding may be internal or external. This includes excessive bleeding from periods (in women) or from the intestine.   Poor nutrition.   Chronic kidney, thyroid, and liver disease.  Bone marrow disorders that decrease red blood cell production.  Cancer and treatments for cancer.  HIV, AIDS, and their treatments.  Spleen problems that increase red blood cell destruction.  Blood disorders.  Excess destruction of red blood cells due to infection, medicines, and autoimmune disorders. SIGNS AND SYMPTOMS   Minor weakness.   Dizziness.   Headache.  Palpitations.   Shortness of breath, especially with exercise.   Paleness.  Cold sensitivity.  Indigestion.  Nausea.  Difficulty sleeping.  Difficulty concentrating. Symptoms may occur suddenly or they may develop slowly.  DIAGNOSIS  Additional blood tests are often needed. These help your health care provider determine the best treatment. Your health care provider will check your stool for blood and look for other causes of blood loss.  TREATMENT  Treatment varies depending on the cause of the anemia. Treatment can include:   Supplements of iron, vitamin B12, or folic acid.   Hormone medicines.   A blood transfusion. This may be needed if blood loss is severe.   Hospitalization. This may be needed if there is significant continual blood loss.   Dietary changes.  Spleen removal. HOME CARE INSTRUCTIONS Keep all follow-up appointments. It often takes many weeks to correct anemia, and having your health care provider check on your condition and your response to  treatment is very important. SEEK IMMEDIATE MEDICAL CARE IF:   You develop extreme weakness, shortness of breath, or chest pain.   You become dizzy or have trouble concentrating.  You develop heavy vaginal bleeding.   You develop a rash.   You have bloody or black, tarry stools.   You faint.   You vomit up blood.   You vomit repeatedly.   You have abdominal pain.  You have a fever or persistent symptoms for more than 2-3 days.   You have a fever and your symptoms suddenly get worse.   You are dehydrated.  MAKE SURE YOU:  Understand these instructions.  Will watch your condition.  Will get help right away if you are not doing well or get worse.   This information is not intended to replace advice given to you by your health care provider. Make sure you discuss any questions you have with your health care provider.   Document Released: 06/19/2004 Document Revised: 01/12/2013 Document Reviewed: 11/05/2012 Elsevier Interactive Patient Education 2016 Elsevier Inc.  

## 2015-07-30 NOTE — Progress Notes (Signed)
Pre visit review using our clinic review tool, if applicable. No additional management support is needed unless otherwise documented below in the visit note. 

## 2015-07-30 NOTE — Assessment & Plan Note (Signed)
Continue Flonase and Allegra as needed Will send RX refills to pharmacy

## 2015-07-30 NOTE — Assessment & Plan Note (Signed)
Will check CBC and CMET today 

## 2015-10-16 ENCOUNTER — Telehealth: Payer: Self-pay | Admitting: Internal Medicine

## 2015-10-16 NOTE — Telephone Encounter (Signed)
Record received from [facility]- no pap, mammogram or immunizations on file

## 2015-12-28 DIAGNOSIS — Z6833 Body mass index (BMI) 33.0-33.9, adult: Secondary | ICD-10-CM | POA: Diagnosis not present

## 2015-12-28 DIAGNOSIS — Z01419 Encounter for gynecological examination (general) (routine) without abnormal findings: Secondary | ICD-10-CM | POA: Diagnosis not present

## 2016-04-24 DIAGNOSIS — H5213 Myopia, bilateral: Secondary | ICD-10-CM | POA: Diagnosis not present

## 2016-04-24 DIAGNOSIS — H524 Presbyopia: Secondary | ICD-10-CM | POA: Diagnosis not present

## 2016-06-10 ENCOUNTER — Other Ambulatory Visit: Payer: Self-pay | Admitting: Internal Medicine

## 2016-06-10 DIAGNOSIS — Z1231 Encounter for screening mammogram for malignant neoplasm of breast: Secondary | ICD-10-CM

## 2016-06-13 ENCOUNTER — Telehealth: Payer: Self-pay

## 2016-06-13 NOTE — Telephone Encounter (Signed)
Call pt:  Does she want to be seen at [facility]?

## 2016-06-13 NOTE — Telephone Encounter (Signed)
PLEASE NOTE: All timestamps contained within this report are represented as [place] Standard Time. CONFIDENTIALTY NOTICE: This fax transmission is intended only for the addressee. It contains information that is legally privileged, confidential or otherwise protected from use or disclosure. If you are not the intended recipient, you are strictly prohibited from reviewing, disclosing, copying using or disseminating any of this information or taking any action in reliance on or regarding this information. If you have received this fax in error, please notify us immediately by telephone so that we can arrange for its return to [place]. Phone: [phone], Toll-Free: [phone], Fax: [phone] Page: 1 of 2 Call Id: [id number] [place] Patient Name: Sara Mercer Gender: Female DOB: [birth year] Age: 51 Y 45 M 16 D Return Phone Number: [phone] (Primary) Address: City/State/Zip: [place] [address] Client [facility] Night - Client Client Site [place] Physician Webb Silversmith - NP Contact Type Call Who Is Calling Patient / Member / Family / Caregiver Call Type Triage / Clinical Relationship To Patient Self Return Phone Number [phone] (Primary) Chief Complaint Flu Symptom Reason for Call Symptomatic / Request for [place] states that she has aches, chills, flu exposure, and she has a low-grade Fever 99.0. PreDisposition Did not know what to do Translation No Nurse Assessment Nurse: Angeline Slim, RN, [name] Date/Time (Eastern Time): 06/10/2016 5:48:20 PM Confirm and document reason for call. If symptomatic, describe symptoms. ---Caller states she was exposed to flu and has temp of 99 orally and has body aches and had chills yesterday Does the patient have any new or worsening symptoms? ---Yes Will a triage be completed?  ---Yes Related visit to physician within the last 2 weeks? ---No Does the PT have any chronic conditions? (i.e. diabetes, asthma, etc.) ---No Is the patient pregnant or possibly pregnant? (Ask all females between the ages of 58-55) ---No Is this a behavioral health or substance abuse call? ---No Guidelines Guideline Title Affirmed Question Affirmed Notes Nurse Date/Time (Eastern Time) Influenza - Seasonal [1] Probable mild influenza (no fever) or a common cold, with no complications (all triage questions negative) AND [2] NOT HIGH RISK [name], RN, [name] 06/10/2016 5:49:56 PM Disp. Time Eilene Ghazi Time) Disposition Final User 06/10/2016 5:28:03 PM Attempt made - message left [name], RN, [name] 06/10/2016 5:51:49 PM Home Care Yes [name], RN, [name] PLEASE NOTE: All timestamps contained within this report are represented as [place] Standard Time. CONFIDENTIALTY NOTICE: This fax transmission is intended only for the addressee. It contains information that is legally privileged, confidential or otherwise protected from use or disclosure. If you are not the intended recipient, you are strictly prohibited from reviewing, disclosing, copying using or disseminating any of this information or taking any action in reliance on or regarding this information. If you have received this fax in error, please notify us immediately by telephone so that we can arrange for its return to [place]. Phone: [phone], Toll-Free: [phone], Fax: [phone] Page: 2 of 2 Call Id: [id number] Caller Understands: Yes Disagree/Comply: Comply Care Advice Given Per Guideline HOME CARE: You should be able to treat this at home. * Influenza is commonly known as the 'flu'. * Feeling dehydrated: Drink extra liquids. If the air in your home is dry, use a humidifier. * Cough: Use cough drops. * PSEUDOEPHEDRINE (Sudafed) is available OTC in pill form. Typical adult dosage is two 30 mg tablets every 6 hours. * Difficulty  breathing occurs * Fever lasts over 3 days CALL BACK IF: * You become worse.

## 2016-06-13 NOTE — Telephone Encounter (Signed)
Left detailed msg on VM per HIPAA  

## 2016-06-20 ENCOUNTER — Encounter: Payer: Self-pay | Admitting: Family Medicine

## 2016-06-20 ENCOUNTER — Telehealth: Payer: Self-pay | Admitting: Internal Medicine

## 2016-06-20 ENCOUNTER — Ambulatory Visit (INDEPENDENT_AMBULATORY_CARE_PROVIDER_SITE_OTHER): Payer: 59 | Admitting: Family Medicine

## 2016-06-20 VITALS — BP 138/86 | HR 62 | Temp 98.6°F | Wt 197.6 lb

## 2016-06-20 DIAGNOSIS — R072 Precordial pain: Secondary | ICD-10-CM

## 2016-06-20 DIAGNOSIS — M545 Low back pain, unspecified: Secondary | ICD-10-CM

## 2016-06-20 DIAGNOSIS — R5383 Other fatigue: Secondary | ICD-10-CM

## 2016-06-20 LAB — CBC WITH DIFFERENTIAL/PLATELET
BASOS ABS: 0 10*3/uL (ref 0.0–0.1)
Basophils Relative: 0.3 % (ref 0.0–3.0)
EOS ABS: 0 10*3/uL (ref 0.0–0.7)
Eosinophils Relative: 0.3 % (ref 0.0–5.0)
HEMATOCRIT: 33.8 % — AB (ref 36.0–46.0)
HEMOGLOBIN: 10.9 g/dL — AB (ref 12.0–15.0)
LYMPHS PCT: 27.3 % (ref 12.0–46.0)
Lymphs Abs: 2.6 10*3/uL (ref 0.7–4.0)
MCHC: 32.3 g/dL (ref 30.0–36.0)
MCV: 70.5 fl — ABNORMAL LOW (ref 78.0–100.0)
Monocytes Absolute: 1 10*3/uL (ref 0.1–1.0)
Monocytes Relative: 10.7 % (ref 3.0–12.0)
Neutro Abs: 5.8 10*3/uL (ref 1.4–7.7)
Neutrophils Relative %: 61.4 % (ref 43.0–77.0)
Platelets: 320 10*3/uL (ref 150.0–400.0)
RBC: 4.79 Mil/uL (ref 3.87–5.11)
RDW: 15.3 % (ref 11.5–15.5)
WBC: 9.5 10*3/uL (ref 4.0–10.5)

## 2016-06-20 LAB — COMPREHENSIVE METABOLIC PANEL
ALBUMIN: 4.2 g/dL (ref 3.5–5.2)
ALT: 23 U/L (ref 0–35)
AST: 20 U/L (ref 0–37)
Alkaline Phosphatase: 40 U/L (ref 39–117)
BILIRUBIN TOTAL: 0.3 mg/dL (ref 0.2–1.2)
BUN: 8 mg/dL (ref 6–23)
CALCIUM: 9.3 mg/dL (ref 8.4–10.5)
CO2: 29 mEq/L (ref 19–32)
CREATININE: 0.8 mg/dL (ref 0.40–1.20)
Chloride: 105 mEq/L (ref 96–112)
GFR: 97.85 mL/min (ref >60.00)
Glucose, Bld: 83 mg/dL (ref 70–99)
Potassium: 3.9 mEq/L (ref 3.5–5.1)
Sodium: 141 mEq/L (ref 135–145)
Total Protein: 7 g/dL (ref 6.0–8.3)

## 2016-06-20 NOTE — Progress Notes (Addendum)
Subjective:    Patient ID: Sara Mercer, female    DOB: [birth year], 51 y.o.   MRN: [id number]  HPI This is a 51 yo female who presents today with low back pain x several days. Awakened from sleep this morning with pain. Pain radiates from low right side of back to right side of abdomen and pain upper chest that feels raw. Feels raw with deep breath. No recent cough. No fever. Has felt fatigued for last several weeks. No recent travel, no leg/calf pain.  Had two days of diarrhea last week, resolved spontaneously. A little headache, has restarted flonase. Took 800 mg ibuprofen this morning. It helped right sided back pain, but not raw feeling with deep breath. Feels like she is moving a little slower than usual, no SOB or wheeze. Has been able to perform job duties, but has been very tired at end of day. No unusual activities or environmental exposures. No history of asthma or smoking. Moved some things around in her   No dysuria, no hematuria, no frequency. Mild nausea x 1-2 weeks with diarrhea. LMP 06/02/16, she denies possibility of pregnancy. No problems with menses, no prior ovarian cysts.   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 Lives with her mother.   Past Medical History:  Diagnosis Date  . Allergy   . Anemia   . Chicken pox   . 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 GERD (gastroesophageal reflux disease)   . Positive TB test   . Thalassemia minor    Past Surgical History:  Procedure Laterality Date  . BREAST BIOPSY  08/2011   negative results  . CHOLECYSTECTOMY  1992  . WISDOM TOOTH EXTRACTION     Family History  Problem Relation Age of Onset  . Hypertension Maternal Grandmother   . Heart disease Maternal Grandmother     heart failure  . Glaucoma Maternal Grandfather   . Diabetes Maternal Grandfather   . Hypertension Maternal Grandfather   . Hypertension Father   . Stroke Father   . Diabetes Mother   . Glaucoma Mother   .  Cancer Neg Hx    Social History  Substance Use Topics  . Smoking status: Never Smoker  . Smokeless tobacco: Never Used  . Alcohol use 0.0 oz/week     Comment: occasional      Review of Systems  Constitutional: Positive for fatigue. Negative for appetite change and fever.  Respiratory: Negative for cough, chest tightness, shortness of breath and wheezing.        Feels raw in chest with deep breath.   Cardiovascular: Positive for chest pain. Negative for palpitations and leg swelling.  Gastrointestinal: Positive for abdominal pain (RUQ under breast, radiates from right side of back. ). Negative for diarrhea, nausea and vomiting.  Genitourinary: Negative for difficulty urinating, dysuria, frequency, hematuria and menstrual problem.  Musculoskeletal: Positive for back pain (right sided, low back).  Neurological: Positive for headaches (mild, generalized). Negative for weakness and numbness.       Objective:   Physical Exam  Constitutional: She is oriented to person, place, and time. She appears well-developed and well-nourished. No distress.  HENT:  Head: Normocephalic and atraumatic.  Eyes: Conjunctivae are normal.  Cardiovascular: Normal rate, regular rhythm and normal heart sounds.   Pulmonary/Chest: Effort normal and breath sounds normal. No respiratory distress. She has no wheezes. She has no rales. She exhibits tenderness (left sternal border).  Abdominal: Bowel sounds  are normal. There is no tenderness. There is no rebound.  Musculoskeletal: Normal range of motion.       Lumbar back: She exhibits tenderness (right lateral). She exhibits normal range of motion, no bony tenderness, no swelling and no deformity.  Good ROM, UE/LE strength 5/5, right sided pain with lateral twist to left. No skin rash or erythema.   Neurological: She is alert and oriented to person, place, and time.  Skin: Skin is warm and dry. She is not diaphoretic.  Psychiatric: She has a normal mood and  affect. Her behavior is normal. Judgment and thought content normal.  Vitals reviewed.     BP 138/86 (BP Location: Left Arm, Patient Position: Sitting, Cuff Size: Normal)   Pulse 62   Temp 98.6 F (37 C) (Oral)   Wt 197 lb 9.6 oz (89.6 kg)   LMP 06/02/2016 (Approximate)   SpO2 99%   BMI 33.92 kg/m  Wt Readings from Last 3 Encounters:  06/20/16 197 lb 9.6 oz (89.6 kg)  07/30/15 196 lb (88.9 kg)  10/23/14 200 lb (90.7 kg)   EKG- Sinus bradycardia, rate 55, low voltage in precordial leads    Assessment & Plan:  1. Precordial pain - she is tender over sternum, likely post viral/musculoskeletal. Encouraged her to continue ibuprofen, try heat - RTC/ER precautions reviewed - EKG 12-Lead - CBC with Differential/Platelet - Comprehensive metabolic panel  2. Other fatigue - CBC with Differential/Platelet - Comprehensive metabolic panel  3. Acute right-sided low back pain without sciatica - No worrisome physical findings or history - ibuprofen, heat, ROM   Clarene Reamer, FNP-BC  [facility]  06/20/2016 2:46 PM

## 2016-06-20 NOTE — Patient Instructions (Signed)
It was a pleasure to meet you today.  Please continue ibuprofen 400-800 mg every 8-12 hours as needed Try heat Do gentle range of motion If severe pain or new shortness of breath, go to ER I will contact you when labs available and check on your symptoms

## 2016-06-20 NOTE — Telephone Encounter (Signed)
[facility] Patient Name: Sara Mercer DOB: [birth year] Initial Comment Caller states she has back pain since Wednesday, pain moving front right, deep breath uncomfortable. Ibuproferen has been taken the last couple of days with it. Nurse Assessment Nurse: Dimas Chyle, RN, Dellis Filbert Date/Time Eilene Ghazi Time): 06/20/2016 9:42:54 AM Confirm and document reason for call. If symptomatic, describe symptoms. ---Caller states she has back pain since Wednesday, pain moving front right, deep breath uncomfortable. Ibuprofren has been taken the last couple of days with it. Does the patient have any new or worsening symptoms? ---Yes Will a triage be completed? ---Yes Related visit to physician within the last 2 weeks? ---No Does the PT have any chronic conditions? (i.e. diabetes, asthma, etc.) ---No Is the patient pregnant or possibly pregnant? (Ask all females between the ages of 56-55) ---No Is this a behavioral health or substance abuse call? ---No Guidelines Guideline Title Affirmed Question Affirmed Notes Chest Pain [1] Chest pain(s) lasting a few seconds AND [2] persists > 3 days Final Disposition User See PCP When Office is Open (within 3 days) Dimas Chyle, Therapist, sports, Dellis Filbert Comments No appointments available at [facility], or [facility]. Disagree/Comply: Comply

## 2016-06-20 NOTE — Telephone Encounter (Signed)
Pt scheduled appt with Glenda Chroman FNP at [facility] 06/20/16 at 1:30.

## 2016-06-20 NOTE — Progress Notes (Signed)
Pre visit review using our clinic review tool, if applicable. No additional management support is needed unless otherwise documented below in the visit note. 

## 2016-06-21 ENCOUNTER — Encounter: Payer: Self-pay | Admitting: Family Medicine

## 2016-07-04 ENCOUNTER — Ambulatory Visit
Admission: RE | Admit: 2016-07-04 | Discharge: 2016-07-04 | Disposition: A | Discharge status: Home / Self Care | Payer: 59 | Source: Ambulatory Visit | Attending: Internal Medicine | Admitting: Internal Medicine

## 2016-07-04 DIAGNOSIS — Z1231 Encounter for screening mammogram for malignant neoplasm of breast: Secondary | ICD-10-CM

## 2017-06-02 ENCOUNTER — Other Ambulatory Visit: Payer: Self-pay | Admitting: Internal Medicine

## 2017-06-02 DIAGNOSIS — Z1231 Encounter for screening mammogram for malignant neoplasm of breast: Secondary | ICD-10-CM

## 2017-07-30 ENCOUNTER — Ambulatory Visit
Admission: RE | Admit: 2017-07-30 | Discharge: 2017-07-30 | Disposition: A | Discharge status: Home / Self Care | Payer: PRIVATE HEALTH INSURANCE | Source: Ambulatory Visit | Attending: Internal Medicine | Admitting: Internal Medicine

## 2017-07-30 DIAGNOSIS — Z1231 Encounter for screening mammogram for malignant neoplasm of breast: Secondary | ICD-10-CM

## 2017-08-20 ENCOUNTER — Ambulatory Visit (INDEPENDENT_AMBULATORY_CARE_PROVIDER_SITE_OTHER): Payer: PRIVATE HEALTH INSURANCE | Admitting: Internal Medicine

## 2017-08-20 ENCOUNTER — Encounter: Payer: Self-pay | Admitting: Internal Medicine

## 2017-08-20 VITALS — BP 118/80 | HR 52 | Temp 98.4°F | Ht 64.5 in | Wt 202.2 lb

## 2017-08-20 DIAGNOSIS — Z0000 Encounter for general adult medical examination without abnormal findings: Secondary | ICD-10-CM | POA: Diagnosis not present

## 2017-08-20 DIAGNOSIS — K219 Gastro-esophageal reflux disease without esophagitis: Secondary | ICD-10-CM

## 2017-08-20 LAB — VITAMIN D 25 HYDROXY (VIT D DEFICIENCY, FRACTURES): VITD: 27.13 ng/mL — ABNORMAL LOW (ref 30.00–100.00)

## 2017-08-20 LAB — COMPREHENSIVE METABOLIC PANEL
ALT: 27 U/L (ref 0–35)
AST: 26 U/L (ref 0–37)
Albumin: 4.1 g/dL (ref 3.5–5.2)
Alkaline Phosphatase: 43 U/L (ref 39–117)
BUN: 11 mg/dL (ref 6–23)
CHLORIDE: 104 meq/L (ref 96–112)
CO2: 30 meq/L (ref 19–32)
Calcium: 9.9 mg/dL (ref 8.4–10.5)
Creatinine, Ser: 0.83 mg/dL (ref 0.40–1.20)
GFR: 93.33 mL/min (ref >60.00)
GLUCOSE: 96 mg/dL (ref 70–99)
POTASSIUM: 3.9 meq/L (ref 3.5–5.1)
Sodium: 141 mEq/L (ref 135–145)
Total Bilirubin: 0.3 mg/dL (ref 0.2–1.2)
Total Protein: 7.7 g/dL (ref 6.0–8.3)

## 2017-08-20 LAB — CBC
HCT: 36.7 % (ref 36.0–46.0)
HEMOGLOBIN: 11.7 g/dL — AB (ref 12.0–15.0)
MCHC: 31.9 g/dL (ref 30.0–36.0)
MCV: 71.6 fl — ABNORMAL LOW (ref 78.0–100.0)
Platelets: 283 10*3/uL (ref 150.0–400.0)
RBC: 5.13 Mil/uL — ABNORMAL HIGH (ref 3.87–5.11)
RDW: 15.6 % — AB (ref 11.5–15.5)
WBC: 6.9 10*3/uL (ref 4.0–10.5)

## 2017-08-20 LAB — LIPID PANEL
CHOL/HDL RATIO: 4
Cholesterol: 200 mg/dL (ref 0–200)
HDL: 56.6 mg/dL (ref >39.00)
LDL Cholesterol: 118 mg/dL — ABNORMAL HIGH (ref 0–99)
NONHDL: 143.57
Triglycerides: 129 mg/dL (ref 0.0–149.0)
VLDL: 25.8 mg/dL (ref 0.0–40.0)

## 2017-08-20 LAB — HEMOGLOBIN A1C: Hgb A1c MFr Bld: 6.7 % — ABNORMAL HIGH (ref 4.6–6.5)

## 2017-08-20 LAB — TSH: TSH: 1.3 u[IU]/mL (ref 0.35–4.50)

## 2017-08-20 NOTE — Assessment & Plan Note (Signed)
Discussed avoiding foods that trigger your reflux Discussed how weight loss may help improve reflux Continue Prevacid as needed

## 2017-08-20 NOTE — Patient Instructions (Signed)

## 2017-08-20 NOTE — Progress Notes (Signed)
Subjective:    Patient ID: Sara Mercer, female    DOB: November 25, 1965, 52 y.o.   MRN: [id number]  HPI  Pt presents to the clinic today for her annual exam.   GERD: Triggered by by vinaigrette dressings etc. She take Prevacid as needed.  Flu: 01/2017 Tetanus: 10/2014 Pap Smear: 07/2016, [facility] Mammogram: 07/2017 Colon Screening: never Vision Screening: yearly Dentist: as needed  Diet: She does eat meat. She consumes fruits and veggies. She does eat fried foods. She drinks mostly coffee and water. Exercise: None  Review of Systems      Past Medical History:  Diagnosis Date  . Allergy   . Anemia   . Chicken pox   . 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 GERD (gastroesophageal reflux disease)   . Positive TB test   . Thalassemia minor     Current Outpatient Medications  Medication Sig Dispense Refill  . b complex vitamins tablet Take 1 tablet by mouth daily.    . Biotin 1000 MCG tablet Take 1,000 mcg by mouth daily.    . Cholecalciferol (VITAMIN D) 2000 units tablet Take 2,000 Units by mouth daily.    . fexofenadine (ALLEGRA) 180 MG tablet Take 1 tablet (180 mg total) by mouth daily. 30 tablet 5  . fluticasone (FLONASE) 50 MCG/ACT nasal spray Place 2 sprays into both nostrils daily. 16 g 5  . ibuprofen (ADVIL,MOTRIN) 200 MG tablet Take 800 mg by mouth as needed.    . lansoprazole (PREVACID) 15 MG capsule Take 15 mg by mouth daily at 12 noon.    . Multiple Vitamin (MULTIVITAMIN) tablet Take 1 tablet by mouth daily.    . vitamin E 400 UNIT capsule Take 400 Units by mouth daily.     No current facility-administered medications for this visit.     No Known Allergies  Family History  Problem Relation Age of Onset  . Hypertension Maternal Grandmother   . Heart disease Maternal Grandmother        heart failure  . Glaucoma Maternal Grandfather   . Diabetes Maternal Grandfather   . Hypertension Maternal Grandfather   . Hypertension Father     . Stroke Father   . Diabetes Mother   . Glaucoma Mother   . Cancer Neg Hx     Social History   Socioeconomic History  . Marital status: Divorced    Spouse name: Not on file  . Number of children: Not on file  . Years of education: Not on file  . Highest education level: Not on file  Occupational History  . Not on file  Social Needs  . Financial resource strain: Not on file  . Food insecurity:    Worry: Not on file    Inability: Not on file  . Transportation needs:    Medical: Not on file    Non-medical: Not on file  Tobacco Use  . Smoking status: Never Smoker  . Smokeless tobacco: Never Used  Substance and Sexual Activity  . Alcohol use: Yes    Alcohol/week: 0.0 oz    Comment: occasional  . Drug use: No  . Sexual activity: Not Currently    Birth control/protection: None  Lifestyle  . Physical activity:    Days per week: Not on file    Minutes per session: Not on file  . Stress: Not on file  Relationships  . Social connections:    Talks on phone: Not on file  Gets together: Not on file    Attends religious service: Not on file    Active member of club or organization: Not on file    Attends meetings of clubs or organizations: Not on file    Relationship status: Not on file  . Intimate partner violence:    Fear of current or ex partner: Not on file    Emotionally abused: Not on file    Physically abused: Not on file    Forced sexual activity: Not on file  Other Topics Concern  . Not on file  Social History Narrative  . Not on file     Constitutional: Pt reports fatigue. Denies fever, malaise, fatigue, headache or abrupt weight changes.  HEENT: Denies eye pain, eye redness, ear pain, ringing in the ears, wax buildup, runny nose, nasal congestion, bloody nose, or sore throat. Respiratory: Denies difficulty breathing, shortness of breath, cough or sputum production.   Cardiovascular: Denies chest pain, chest tightness, palpitations or swelling in the hands  or feet.  Gastrointestinal: Pt reports intermittent reflux. Denies abdominal pain, bloating, constipation, diarrhea or blood in the stool.  GU: Denies urgency, frequency, pain with urination, burning sensation, blood in urine, odor or discharge. Musculoskeletal: Denies decrease in range of motion, difficulty with gait, muscle pain or joint pain and swelling.  Skin: Denies redness, rashes, lesions or ulcercations.  Neurological: Denies dizziness, difficulty with memory, difficulty with speech or problems with balance and coordination.  Psych: Pt has a history of anxiety. Denies depression, SI/HI.  No other specific complaints in a complete review of systems (except as listed in HPI above).  Objective:   Physical Exam  BP 118/80 (BP Location: Left Arm, Patient Position: Sitting, Cuff Size: Large)   Pulse (!) 52   Temp 98.4 F (36.9 C) (Oral)   Ht 5' 4.5" (1.638 m)   Wt 202 lb 4 oz (91.7 kg)   SpO2 98%   BMI 34.18 kg/m  Wt Readings from Last 3 Encounters:  08/20/17 202 lb 4 oz (91.7 kg)  06/20/16 197 lb 9.6 oz (89.6 kg)  07/30/15 196 lb (88.9 kg)    General: Appears her stated age, obese in NAD. Skin: Warm, dry and intact.  HEENT: Head: normal shape and size; Eyes: sclera white, no icterus, conjunctiva pink, PERRLA and EOMs intact; Ears: Tm's gray and intact, normal light reflex; Nose: mucosa pink and moist, septum midline; Throat/Mouth: Teeth present, mucosa pink and moist, no exudate, lesions or ulcerations noted.  Neck:  Neck supple, trachea midline. No masses, lumps or thyromegaly present.  Cardiovascular: Normal rate and rhythm. S1,S2 noted.  No murmur, rubs or gallops noted. No JVD or BLE edema. No carotid bruits noted. Pulmonary/Chest: Normal effort and positive vesicular breath sounds. No respiratory distress. No wheezes, rales or ronchi noted.  Abdomen: Soft and nontender. Normal bowel sounds. No distention or masses noted. Liver, spleen and kidneys non  palpable. Musculoskeletal: Strength 5/5 BUE/BLE. No difficulty with gait.  Neurological: Alert and oriented. Cranial nerves II-XII grossly intact. Coordination normal.  Psychiatric: Mood and affect normal. Behavior is normal. Judgment and thought content normal.    BMET    Component Value Date/Time   NA 141 06/20/2016 1412   K 3.9 06/20/2016 1412   CL 105 06/20/2016 1412   CO2 29 06/20/2016 1412   GLUCOSE 83 06/20/2016 1412   BUN 8 06/20/2016 1412   CREATININE 0.80 06/20/2016 1412   CALCIUM 9.3 06/20/2016 1412    Lipid Panel  Component Value Date/Time   CHOL 206 (H) 07/30/2015 1008   TRIG 86.0 07/30/2015 1008   HDL 61.20 07/30/2015 1008   CHOLHDL 3 07/30/2015 1008   VLDL 17.2 07/30/2015 1008   LDLCALC 128 (H) 07/30/2015 1008    CBC    Component Value Date/Time   WBC 9.5 06/20/2016 1412   RBC 4.79 06/20/2016 1412   HGB 10.9 (L) 06/20/2016 1412   HCT 33.8 (L) 06/20/2016 1412   PLT 320.0 06/20/2016 1412   MCV 70.5 (L) 06/20/2016 1412   MCHC 32.3 06/20/2016 1412   RDW 15.3 06/20/2016 1412   LYMPHSABS 2.6 06/20/2016 1412   MONOABS 1.0 06/20/2016 1412   EOSABS 0.0 06/20/2016 1412   BASOSABS 0.0 06/20/2016 1412    Hgb A1C Lab Results  Component Value Date   HGBA1C 6.2 07/30/2015            Assessment & Plan:   Preventative Health Maintenance:  Flu and tetanus UTD Pap smear and mammogram UTD She declines colonoscopy, will check with insurance about Cologuard Encouraged her to consume a balance diet and exercise regimen Advised her to see an eye doctor and dentist annually Will check CBC, CMET, Lipid, TSH, A1C and Vit D today  RTC in 1 year, sooner if needed Webb Silversmith, NP

## 2017-08-26 ENCOUNTER — Other Ambulatory Visit: Payer: Self-pay | Admitting: Internal Medicine

## 2017-08-26 ENCOUNTER — Encounter: Payer: Self-pay | Admitting: Internal Medicine

## 2017-08-26 DIAGNOSIS — E119 Type 2 diabetes mellitus without complications: Secondary | ICD-10-CM

## 2017-08-26 MED ORDER — METFORMIN HCL 500 MG PO TABS: 500.0000 mg | ORAL_TABLET | Freq: Every day | ORAL | 2 refills | Status: DC

## 2017-08-28 ENCOUNTER — Ambulatory Visit (INDEPENDENT_AMBULATORY_CARE_PROVIDER_SITE_OTHER): Payer: PRIVATE HEALTH INSURANCE | Admitting: Internal Medicine

## 2017-08-28 ENCOUNTER — Encounter: Payer: Self-pay | Admitting: Internal Medicine

## 2017-08-28 VITALS — BP 122/82 | HR 64 | Temp 98.2°F | Ht 64.5 in | Wt 206.0 lb

## 2017-08-28 DIAGNOSIS — E119 Type 2 diabetes mellitus without complications: Secondary | ICD-10-CM

## 2017-08-28 MED ORDER — METFORMIN HCL ER 500 MG PO TB24: 500.0000 mg | ORAL_TABLET | Freq: Every day | ORAL | 2 refills | Status: DC

## 2017-09-03 ENCOUNTER — Encounter: Payer: Self-pay | Admitting: Internal Medicine

## 2017-09-03 DIAGNOSIS — E119 Type 2 diabetes mellitus without complications: Secondary | ICD-10-CM | POA: Insufficient documentation

## 2017-09-03 NOTE — Patient Instructions (Signed)
Diabetes Mellitus and Standards of Medical Care Managing diabetes (diabetes mellitus) can be complicated. Your diabetes treatment may be managed by a team of health care providers, including:  A diet and nutrition specialist (registered dietitian).  A nurse.  A certified diabetes educator (CDE).  A diabetes specialist (endocrinologist).  An eye doctor.  A primary care provider.  A dentist.  Your health care providers follow a schedule in order to help you get the best quality of care. The following schedule is a general guideline for your diabetes management plan. Your health care providers may also give you more specific instructions. HbA1c ( hemoglobin A1c) test This test provides information about blood sugar (glucose) control over the previous 2-3 months. It is used to check whether your diabetes management plan needs to be adjusted.  If you are meeting your treatment goals, this test is done at least 2 times a year.  If you are not meeting treatment goals or if your treatment goals have changed, this test is done 4 times a year.  Blood pressure test  This test is done at every routine medical visit. For most people, the goal is less than 130/80. Ask your health care provider what your goal blood pressure should be. Dental and eye exams  Visit your dentist two times a year.  If you have type 1 diabetes, get an eye exam 3-5 years after you are diagnosed, and then once a year after your first exam. ? If you were diagnosed with type 1 diabetes as a child, get an eye exam when you are age 16 or older and have had diabetes for 3-5 years. After the first exam, you should get an eye exam once a year.  If you have type 2 diabetes, have an eye exam as soon as you are diagnosed, and then once a year after your first exam. Foot care exam  Visual foot exams are done at every routine medical visit. The exams check for cuts, bruises, redness, blisters, sores, or other problems with the  feet.  A complete foot exam is done by your health care provider once a year. This exam includes an inspection of the structure and skin of your feet, and a check of the pulses and sensation in your feet. ? Type 1 diabetes: Get your first exam 3-5 years after diagnosis. ? Type 2 diabetes: Get your first exam as soon as you are diagnosed.  Check your feet every day for cuts, bruises, redness, blisters, or sores. If you have any of these or other problems that are not healing, contact your health care provider. Kidney function test ( urine microalbumin)  This test is done once a year. ? Type 1 diabetes: Get your first test 5 years after diagnosis. ? Type 2 diabetes: Get your first test as soon as you are diagnosed.  If you have chronic kidney disease (CKD), get a serum creatinine and estimated glomerular filtration rate (eGFR) test once a year. Lipid profile (cholesterol, HDL, LDL, triglycerides)  This test should be done when you are diagnosed with diabetes, and every 5 years after the first test. If you are on medicines to lower your cholesterol, you may need to get this test done every year. ? The goal for LDL is less than 100 mg/dL (5.5 mmol/L). If you are at high risk, the goal is less than 70 mg/dL (3.9 mmol/L). ? The goal for HDL is 40 mg/dL (2.2 mmol/L) for men and 50 mg/dL(2.8 mmol/L) for women. An HDL  cholesterol of 60 mg/dL (3.3 mmol/L) or higher gives some protection against heart disease. ? The goal for triglycerides is less than 150 mg/dL (8.3 mmol/L). Immunizations  The yearly flu (influenza) vaccine is recommended for everyone 6 months or older who has diabetes.  The pneumonia (pneumococcal) vaccine is recommended for everyone 2 years or older who has diabetes. If you are 97 or older, you may get the pneumonia vaccine as a series of two separate shots.  The hepatitis B vaccine is recommended for adults shortly after they have been diagnosed with diabetes.  The Tdap  (tetanus, diphtheria, and pertussis) vaccine should be given: ? According to normal childhood vaccination schedules, for children. ? Every 10 years, for adults who have diabetes.  The shingles vaccine is recommended for people who have had chicken pox and are 50 years or older. Mental and emotional health  Screening for symptoms of eating disorders, anxiety, and depression is recommended at the time of diagnosis and afterward as needed. If your screening shows that you have symptoms (you have a positive screening result), you may need further evaluation and be referred to a mental health care provider. Diabetes self-management education  Education about how to manage your diabetes is recommended at diagnosis and ongoing as needed. Treatment plan  Your treatment plan will be reviewed at every medical visit. Summary  Managing diabetes (diabetes mellitus) can be complicated. Your diabetes treatment may be managed by a team of health care providers.  Your health care providers follow a schedule in order to help you get the best quality of care.  Standards of care including having regular physical exams, blood tests, blood pressure monitoring, immunizations, screening tests, and education about how to manage your diabetes.  Your health care providers may also give you more specific instructions based on your individual health. This information is not intended to replace advice given to you by your health care provider. Make sure you discuss any questions you have with your health care provider. Document Released: 03/09/2009 Document Revised: 02/08/2016 Document Reviewed: 02/08/2016 Elsevier Interactive Patient Education  [employer].

## 2017-09-03 NOTE — Progress Notes (Signed)
Subjective:    Patient ID: Sara Mercer, female    DOB: 1965/07/07, 52 y.o.   MRN: [id number]  HPI  Pt presents to the clinic today to follow up lab work. She was recently seen for her CPE, A1C showed an A1C of6.7%. She has never been diagnosed with DM 2 but reports it does run in her family. She denies blurred vision, increased thirst, urinary frequency, non healing wounds or numbness and tingling in her hands and feet.  Review of Systems  Past Medical History:  Diagnosis Date  . Allergy   . Anemia   . Chicken pox   . 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 GERD (gastroesophageal reflux disease)   . Positive TB test   . Thalassemia minor     Current Outpatient Medications  Medication Sig Dispense Refill  . b complex vitamins tablet Take 1 tablet by mouth daily.    . Biotin 1000 MCG tablet Take 1,000 mcg by mouth daily.    . Cholecalciferol (VITAMIN D) 2000 units tablet Take 2,000 Units by mouth daily.    . fexofenadine (ALLEGRA) 180 MG tablet Take 1 tablet (180 mg total) by mouth daily. 30 tablet 5  . fluticasone (FLONASE) 50 MCG/ACT nasal spray Place 2 sprays into both nostrils daily. 16 g 5  . ibuprofen (ADVIL,MOTRIN) 200 MG tablet Take 800 mg by mouth as needed.    . lansoprazole (PREVACID) 15 MG capsule Take 15 mg by mouth daily at 12 noon.    . Multiple Vitamin (MULTIVITAMIN) tablet Take 1 tablet by mouth daily.    . vitamin E 400 UNIT capsule Take 400 Units by mouth daily.    . metFORMIN (GLUCOPHAGE-XR) 500 MG 24 hr tablet Take 1 tablet (500 mg total) by mouth daily with breakfast. 30 tablet 2   No current facility-administered medications for this visit.     No Known Allergies  Family History  Problem Relation Age of Onset  . Hypertension Maternal Grandmother   . Heart disease Maternal Grandmother        heart failure  . Glaucoma Maternal Grandfather   . Diabetes Maternal Grandfather   . Hypertension Maternal Grandfather   . Hypertension Father    . Stroke Father   . Diabetes Mother   . Glaucoma Mother   . Cancer Neg Hx     Social History   Socioeconomic History  . Marital status: Divorced    Spouse name: Not on file  . Number of children: Not on file  . Years of education: Not on file  . Highest education level: Not on file  Occupational History  . Not on file  Social Needs  . Financial resource strain: Not on file  . Food insecurity:    Worry: Not on file    Inability: Not on file  . Transportation needs:    Medical: Not on file    Non-medical: Not on file  Tobacco Use  . Smoking status: Never Smoker  . Smokeless tobacco: Never Used  Substance and Sexual Activity  . Alcohol use: Yes    Alcohol/week: 0.0 oz    Comment: occasional  . Drug use: No  . Sexual activity: Not Currently    Birth control/protection: None  Lifestyle  . Physical activity:    Days per week: Not on file    Minutes per session: Not on file  . Stress: Not on file  Relationships  . Social connections:    Talks  on phone: Not on file    Gets together: Not on file    Attends religious service: Not on file    Active member of club or organization: Not on file    Attends meetings of clubs or organizations: Not on file    Relationship status: Not on file  . Intimate partner violence:    Fear of current or ex partner: Not on file    Emotionally abused: Not on file    Physically abused: Not on file    Forced sexual activity: Not on file  Other Topics Concern  . Not on file  Social History Narrative  . Not on file     Constitutional: Denies fever, malaise, fatigue, headache or abrupt weight changes.  Respiratory: Denies difficulty breathing, shortness of breath, cough or sputum production.   Cardiovascular: Denies chest pain, chest tightness, palpitations or swelling in the hands or feet.  Gastrointestinal: Denies abdominal pain, bloating, constipation, diarrhea or blood in the stool.  GU: Denies urgency, frequency, pain with  urination, burning sensation, blood in urine, odor or discharge. Skin: Denies redness, rashes, lesions or ulcercations.  Neurological: Denies dizziness, difficulty with memory, difficulty with speech or problems with balance and coordination.    No other specific complaints in a complete review of systems (except as listed in HPI above).     Objective:   Physical Exam   BP 122/82 (BP Location: Left Arm, Patient Position: Sitting, Cuff Size: Large)   Pulse 64   Temp 98.2 F (36.8 C) (Oral)   Ht 5' 4.5" (1.638 m)   Wt 206 lb (93.4 kg)   SpO2 98%   BMI 34.81 kg/m  Wt Readings from Last 3 Encounters:  08/28/17 206 lb (93.4 kg)  08/20/17 202 lb 4 oz (91.7 kg)  06/20/16 197 lb 9.6 oz (89.6 kg)    General: Appears her stated age, obese in NAD. Skin: Warm, dry and intact. No ulcerations noted. Cardiovascular: Normal rate and rhythm. S1,S2 noted.  No murmur, rubs or gallops noted.  Pulmonary/Chest: Normal effort and positive vesicular breath sounds. No respiratory distress. No wheezes, rales or ronchi noted.  Neurological: Sensation intact to BLE.   BMET    Component Value Date/Time   NA 141 08/20/2017 1235   K 3.9 08/20/2017 1235   CL 104 08/20/2017 1235   CO2 30 08/20/2017 1235   GLUCOSE 96 08/20/2017 1235   BUN 11 08/20/2017 1235   CREATININE 0.83 08/20/2017 1235   CALCIUM 9.9 08/20/2017 1235    Lipid Panel     Component Value Date/Time   CHOL 200 08/20/2017 1235   TRIG 129.0 08/20/2017 1235   HDL 56.60 08/20/2017 1235   CHOLHDL 4 08/20/2017 1235   VLDL 25.8 08/20/2017 1235   LDLCALC 118 (H) 08/20/2017 1235    CBC    Component Value Date/Time   WBC 6.9 08/20/2017 1235   RBC 5.13 (H) 08/20/2017 1235   HGB 11.7 (L) 08/20/2017 1235   HCT 36.7 08/20/2017 1235   PLT 283.0 08/20/2017 1235   MCV 71.6 (L) 08/20/2017 1235   MCHC 31.9 08/20/2017 1235   RDW 15.6 (H) 08/20/2017 1235   LYMPHSABS 2.6 06/20/2016 1412   MONOABS 1.0 06/20/2016 1412   EOSABS 0.0  06/20/2016 1412   BASOSABS 0.0 06/20/2016 1412    Hgb A1C Lab Results  Component Value Date   HGBA1C 6.7 (H) 08/20/2017           Assessment & Plan:

## 2017-09-03 NOTE — Assessment & Plan Note (Signed)
New onset Discussed diabetes and standards of medical care Discussed low carb diet and increase in aerobic exercise for weight loss Foot exam today Encouraged yearly eye exams She declines pneumovax today eRx for Metformin 500 mg XR daily She declines monitoring sugars  RTC in 3 months for follow up DM 2

## 2017-12-04 ENCOUNTER — Other Ambulatory Visit: Payer: PRIVATE HEALTH INSURANCE

## 2017-12-08 ENCOUNTER — Other Ambulatory Visit: Payer: PRIVATE HEALTH INSURANCE

## 2018-01-08 ENCOUNTER — Other Ambulatory Visit (INDEPENDENT_AMBULATORY_CARE_PROVIDER_SITE_OTHER): Payer: PRIVATE HEALTH INSURANCE

## 2018-01-08 ENCOUNTER — Other Ambulatory Visit: Payer: PRIVATE HEALTH INSURANCE

## 2018-01-08 DIAGNOSIS — E119 Type 2 diabetes mellitus without complications: Secondary | ICD-10-CM

## 2018-01-08 LAB — HEMOGLOBIN A1C: HEMOGLOBIN A1C: 6.6 % — AB (ref 4.6–6.5)

## 2018-06-27 ENCOUNTER — Emergency Department: Payer: PRIVATE HEALTH INSURANCE

## 2018-06-27 ENCOUNTER — Encounter: Payer: Self-pay | Admitting: Internal Medicine

## 2018-06-27 ENCOUNTER — Other Ambulatory Visit: Payer: Self-pay

## 2018-06-27 ENCOUNTER — Emergency Department
Admission: EM | Admit: 2018-06-27 | Discharge: 2018-06-27 | Disposition: A | Discharge status: Home / Self Care | Payer: PRIVATE HEALTH INSURANCE | Attending: Emergency Medicine | Admitting: Emergency Medicine

## 2018-06-27 DIAGNOSIS — Z7984 Long term (current) use of oral hypoglycemic drugs: Secondary | ICD-10-CM | POA: Insufficient documentation

## 2018-06-27 DIAGNOSIS — E119 Type 2 diabetes mellitus without complications: Secondary | ICD-10-CM | POA: Diagnosis not present

## 2018-06-27 DIAGNOSIS — Z79899 Other long term (current) drug therapy: Secondary | ICD-10-CM | POA: Diagnosis not present

## 2018-06-27 DIAGNOSIS — R079 Chest pain, unspecified: Secondary | ICD-10-CM | POA: Insufficient documentation

## 2018-06-27 LAB — TROPONIN I
Troponin I: <0.03 ng/mL (ref <0.03)
Troponin I: <0.03 ng/mL (ref <0.03)

## 2018-06-27 LAB — BASIC METABOLIC PANEL
Anion gap: 6 (ref 5–15)
BUN: 12 mg/dL (ref 6–20)
CALCIUM: 9.3 mg/dL (ref 8.9–10.3)
CO2: 28 mmol/L (ref 22–32)
CREATININE: 0.77 mg/dL (ref 0.44–1.00)
Chloride: 108 mmol/L (ref 98–111)
GFR calc Af Amer: >60 mL/min (ref >60)
GFR calc non Af Amer: >60 mL/min (ref >60)
Glucose, Bld: 86 mg/dL (ref 70–99)
Potassium: 4.2 mmol/L (ref 3.5–5.1)
Sodium: 142 mmol/L (ref 135–145)

## 2018-06-27 LAB — CBC
HCT: 36.7 % (ref 36.0–46.0)
Hemoglobin: 11.2 g/dL — ABNORMAL LOW (ref 12.0–15.0)
MCH: 22.4 pg — ABNORMAL LOW (ref 26.0–34.0)
MCHC: 30.5 g/dL (ref 30.0–36.0)
MCV: 73.4 fL — ABNORMAL LOW (ref 80.0–100.0)
PLATELETS: 277 10*3/uL (ref 150–400)
RBC: 5 MIL/uL (ref 3.87–5.11)
RDW: 15.8 % — AB (ref 11.5–15.5)
WBC: 7.2 10*3/uL (ref 4.0–10.5)
nRBC: 0 % (ref 0.0–0.2)

## 2018-06-27 MED ORDER — SODIUM CHLORIDE 0.9% FLUSH: 3.0000 mL | Freq: Once | INTRAVENOUS | Status: DC

## 2018-06-27 MED ORDER — ASPIRIN 81 MG PO CHEW
324.0000 mg | CHEWABLE_TABLET | Freq: Once | ORAL | Status: AC
  Administered 2018-06-27: 324 mg via ORAL
  Filled 2018-06-27: qty 4

## 2018-06-27 NOTE — ED Triage Notes (Signed)
Pt comes via POV from home with c/o chest pain that started Thursday morning. Pt states heaviness and tightness in left side. Pt also states numbness and tingling.  Pt states it is more constant today. Pt states nausea and little vomiting.  Pt states 5/10 pain/

## 2018-06-27 NOTE — ED Notes (Signed)
Dr. schaevitz at bedside 

## 2018-06-27 NOTE — ED Notes (Signed)
The patient stated that she was no pregnant. No need for a POCT.

## 2018-06-27 NOTE — ED Provider Notes (Signed)
[facility] Emergency Department Provider Note  ____________________________________________   First MD Initiated Contact with Patient 06/27/18 1037     (approximate)  I have reviewed the triage vital signs and the nursing notes.   HISTORY  Chief Complaint Chest Pain   HPI Sara Mercer is a 53 y.o. female who is presenting to the emergency department today with left-sided chest pain over the past 3 to 4 days.  He says that the pain started on Thursday and has been somewhat constant ever since then but has increased in intensity today.  Pain is a squeezing, pressure to the left chest with associated numbness and tingling the left side of the neck.  Patient says that the pain also radiates down the left arm.  Says that she also has mild shortness of breath that is exacerbated with activity.  Chest pain not exacerbated with activity.  Patient reports nausea but no vomiting.  Family history of heart disease in her grandmother.  No first-degree relatives with CAD.  Patient says that she has tried anti-inflammatories as well as antacids without relief over the past several days.   Past Medical History:  Diagnosis Date  . Allergy   . Anemia   . Chicken pox   . 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 GERD (gastroesophageal reflux disease)   . Positive TB test   . Thalassemia minor     Patient Active Problem List   Diagnosis Date Noted  . Type 2 diabetes mellitus without complication, without long-term current use of insulin ([place]) 09/03/2017  . Thalassemia minor 07/30/2015  . GERD (gastroesophageal reflux disease) 07/30/2015  . Seasonal allergies 07/30/2015    Past Surgical History:  Procedure Laterality Date  . BREAST BIOPSY Right 08/2011   negative results  . CHOLECYSTECTOMY  1992  . WISDOM TOOTH EXTRACTION      Prior to Admission medications   Medication Sig Start Date End Date Taking? Authorizing Provider  b complex vitamins tablet  Take 1 tablet by mouth daily.    [provider]  Biotin 1000 MCG tablet Take 1,000 mcg by mouth daily.    [provider]  Cholecalciferol (VITAMIN D) 2000 units tablet Take 2,000 Units by mouth daily.    [provider]  fexofenadine (ALLEGRA) 180 MG tablet Take 1 tablet (180 mg total) by mouth daily. 07/30/15   Jearld Fenton, NP  fluticasone (FLONASE) 50 MCG/ACT nasal spray Place 2 sprays into both nostrils daily. 07/30/15   Jearld Fenton, NP  ibuprofen (ADVIL,MOTRIN) 200 MG tablet Take 800 mg by mouth as needed.    [provider]  lansoprazole (PREVACID) 15 MG capsule Take 15 mg by mouth daily at 12 noon.    [provider]  metFORMIN (GLUCOPHAGE-XR) 500 MG 24 hr tablet Take 1 tablet (500 mg total) by mouth daily with breakfast. 08/28/17   Jearld Fenton, NP  Multiple Vitamin (MULTIVITAMIN) tablet Take 1 tablet by mouth daily.    [provider]  vitamin E 400 UNIT capsule Take 400 Units by mouth daily.    [provider]    Allergies Patient has no known allergies.  Family History  Problem Relation Age of Onset  . Hypertension Maternal Grandmother   . Heart disease Maternal Grandmother        heart failure  . Glaucoma Maternal Grandfather   . Diabetes Maternal Grandfather   . Hypertension Maternal Grandfather   . Hypertension Father   .  Stroke Father   . Diabetes Mother   . Glaucoma Mother   . Cancer Neg Hx     Social History Social History   Tobacco Use  . Smoking status: Never Smoker  . Smokeless tobacco: Never Used  Substance Use Topics  . Alcohol use: Yes    Alcohol/week: 0.0 standard drinks    Comment: occasional  . Drug use: No    Review of Systems  Constitutional: No fever/chills Eyes: No visual changes. ENT: No sore throat. Cardiovascular: As above Respiratory: As above Gastrointestinal: No abdominal pain.  no vomiting.  No diarrhea.  No constipation. Genitourinary: Negative for  dysuria. Musculoskeletal: Negative for back pain. Skin: Negative for rash. Neurological: Negative for headaches, focal weakness    ____________________________________________   PHYSICAL EXAM:  VITAL SIGNS: ED Triage Vitals  Enc Vitals Group     BP 06/27/18 1006 (!) 163/79     Pulse Rate 06/27/18 1006 (!) 50     Resp 06/27/18 1006 18     Temp 06/27/18 1006 98 F (36.7 C)     Temp src --      SpO2 06/27/18 1006 99 %     Weight --      Height --      Head Circumference --      Peak Flow --      Pain Score 06/27/18 1004 5     Pain Loc --      Pain Edu? --      Excl. in [place]? --     Constitutional: Alert and oriented. Well appearing and in no acute distress. Eyes: Conjunctivae are normal.  Head: Atraumatic. Nose: No congestion/rhinnorhea. Mouth/Throat: Mucous membranes are moist.  Neck: No stridor.   Cardiovascular: Normal rate, regular rhythm. Grossly normal heart sounds.  Good peripheral circulation with equal and bilateral radial pulses. Respiratory: Normal respiratory effort.  No retractions. Lungs CTAB. Gastrointestinal: Soft and nontender. No distention. Musculoskeletal: No lower extremity tenderness nor edema.  No joint effusions. Neurologic:  Normal speech and language. No gross focal neurologic deficits are appreciated.  No facial drooping.  Patient with equal sensation to slight touch to both sides of the face. Skin:  Skin is warm, dry and intact. No rash noted. Psychiatric: Mood and affect are normal. Speech and behavior are normal.  ____________________________________________   LABS (all labs ordered are listed, but only abnormal results are displayed)  Labs Reviewed  CBC - Abnormal; Notable for the following components:      Result Value   Hemoglobin 11.2 (*)    MCV 73.4 (*)    MCH 22.4 (*)    RDW 15.8 (*)    All other components within normal limits  BASIC METABOLIC PANEL  TROPONIN I  TROPONIN I    ____________________________________________  EKG  ED ECG REPORT I, Doran Stabler, the attending physician, personally viewed and interpreted this ECG.   Date: 06/27/2018  EKG Time: 0938  Rate: 52  Rhythm: Sinus bradycardia  Axis: Normal  Intervals:none  ST&T Change: No ST segment elevation or depression.  No abnormal T wave inversion.  ____________________________________________  RADIOLOGY  Chest x-ray without acute finding. ____________________________________________   PROCEDURES  Procedure(s) performed:   Procedures  Critical Care performed:   ____________________________________________   INITIAL IMPRESSION / ASSESSMENT AND PLAN / ED COURSE  Pertinent labs & imaging results that were available during my care of the patient were reviewed by me and considered in my medical decision making (see chart for details).  Differential  diagnosis includes, but is not limited to, ACS, aortic dissection, pulmonary embolism, cardiac tamponade, pneumothorax, pneumonia, pericarditis, myocarditis, GI-related causes including esophagitis/gastritis, and musculoskeletal chest wall pain.   As part of my medical decision making, I reviewed the following data within the electronic MEDICAL RECORD NUMBER Notes from prior ED visits  ----------------------------------------- 1:47 PM on 06/27/2018 -----------------------------------------  Patient at this time says that her pain is unchanged.  Treating her troponins.  Very reassuring EKG.  No objective neurologic findings.  I discussed with the patient possible admission for cardiac observation versus discharge and she says that she would rather follow-up in the office.  I believe given her presentation and lab results that this is reasonable.  She knows to return to the hospital for any worsening or concerning symptoms.  Furthermore, the pain appears to be rating up to the side of the face and the neck manifesting as a numbness there.   There are no focal neurologic findings.  Very low suspicion for CVA.  ____________________________________________   FINAL CLINICAL IMPRESSION(S) / ED DIAGNOSES  Chest pain.  NEW MEDICATIONS STARTED DURING THIS VISIT:  New Prescriptions   No medications on file     Note:  This document was prepared using Dragon voice recognition software and may include unintentional dictation errors.     Orbie Pyo, MD 06/27/18 [phone]

## 2018-06-27 NOTE — ED Notes (Signed)
Pt given ginger ale. Informed waiting on repeat lab tests later.

## 2018-06-28 NOTE — Telephone Encounter (Signed)
Pt contacted office to confirm her message has been received and states she is needing a referral for most cardiology offices she has contacted. [phone]

## 2018-06-29 ENCOUNTER — Ambulatory Visit: Payer: PRIVATE HEALTH INSURANCE | Admitting: Internal Medicine

## 2018-06-29 VITALS — BP 136/76 | HR 50 | Temp 98.2°F | Wt 199.0 lb

## 2018-06-29 DIAGNOSIS — R0789 Other chest pain: Secondary | ICD-10-CM | POA: Diagnosis not present

## 2018-06-29 DIAGNOSIS — R202 Paresthesia of skin: Secondary | ICD-10-CM

## 2018-06-29 DIAGNOSIS — R0602 Shortness of breath: Secondary | ICD-10-CM

## 2018-06-29 MED ORDER — NITROGLYCERIN 0.4 MG SL SUBL: 0.4000 mg | SUBLINGUAL_TABLET | SUBLINGUAL | 3 refills | Status: DC | PRN

## 2018-06-30 ENCOUNTER — Ambulatory Visit: Payer: PRIVATE HEALTH INSURANCE | Admitting: Cardiology

## 2018-06-30 ENCOUNTER — Encounter: Payer: Self-pay | Admitting: Cardiology

## 2018-06-30 ENCOUNTER — Telehealth (HOSPITAL_COMMUNITY): Payer: Self-pay

## 2018-06-30 ENCOUNTER — Ambulatory Visit (HOSPITAL_BASED_OUTPATIENT_CLINIC_OR_DEPARTMENT_OTHER): Payer: PRIVATE HEALTH INSURANCE

## 2018-06-30 ENCOUNTER — Encounter: Payer: Self-pay | Admitting: Internal Medicine

## 2018-06-30 ENCOUNTER — Encounter: Payer: Self-pay | Admitting: *Deleted

## 2018-06-30 VITALS — BP 130/72 | HR 52 | Ht 64.5 in | Wt 198.0 lb

## 2018-06-30 DIAGNOSIS — E119 Type 2 diabetes mellitus without complications: Secondary | ICD-10-CM

## 2018-06-30 DIAGNOSIS — R0789 Other chest pain: Secondary | ICD-10-CM

## 2018-06-30 DIAGNOSIS — Z6833 Body mass index (BMI) 33.0-33.9, adult: Secondary | ICD-10-CM | POA: Diagnosis not present

## 2018-06-30 DIAGNOSIS — E782 Mixed hyperlipidemia: Secondary | ICD-10-CM

## 2018-06-30 LAB — H. PYLORI ANTIBODY, IGG: H Pylori IgG: NEGATIVE

## 2018-06-30 LAB — HEPATIC FUNCTION PANEL
ALT: 25 U/L (ref 0–35)
AST: 23 U/L (ref 0–37)
Albumin: 4.4 g/dL (ref 3.5–5.2)
Alkaline Phosphatase: 45 U/L (ref 39–117)
BILIRUBIN TOTAL: 0.3 mg/dL (ref 0.2–1.2)
Bilirubin, Direct: 0 mg/dL (ref 0.0–0.3)
Total Protein: 7.4 g/dL (ref 6.0–8.3)

## 2018-06-30 LAB — AMYLASE: Amylase: 43 U/L (ref 27–131)

## 2018-06-30 LAB — ECHOCARDIOGRAM COMPLETE
Height: 64.5 in
WEIGHTICAEL: 3168 [oz_av]

## 2018-06-30 LAB — D-DIMER, QUANTITATIVE: D-Dimer, Quant: 0.26 mcg/mL FEU (ref <0.50)

## 2018-06-30 LAB — LIPASE: Lipase: 16 U/L (ref 11.0–59.0)

## 2018-06-30 NOTE — Progress Notes (Signed)
Cardiology Office Note:    Date:  06/30/2018   ID:  Sara Mercer, DOB Mar 27, 1966, MRN [id number]  PCP:  Jearld Fenton, NP  Cardiologist:  Jenean Lindau, MD   Referring MD: Jearld Fenton, NP    ASSESSMENT:    1. Chest tightness   2. Type 2 diabetes mellitus without complication, without long-term current use of insulin ([place])   3. Mixed dyslipidemia   4. BMI 33.0-33.9,adult    PLAN:    In order of problems listed above:  1. I discussed my findings with the patient at extensive length.  At at the time of my evaluation, the patient is alert awake oriented and in no distress.  She is chest pain-free.  With activities of daily living she has not had any worsening of symptoms.  Her blood work was largely unremarkable.  Her EKG also was unremarkable.  In view of this I have scheduled her for a exercise stress Cardiolite.  This will be done at the soonest possible available time.  Echocardiogram will be done to assess murmur heard on auscultation. 2. Diet was discussed for dyslipidemia, diabetes mellitus and obesity and risks were explained and she vocalized understanding.  If these the aforementioned tests are negative I asked her to start an exercise program and also work aggressively towards losing weight and she is agreeable. 3. Patient will be seen in follow-up appointment in 6 months or earlier if the patient has any concerns 4. Sublingual nitroglycerin prescription was sent, its protocol and 911 protocol explained and the patient vocalized understanding questions were answered to the patient's satisfaction   Medication Adjustments/Labs and Tests Ordered: Current medicines are reviewed at length with the patient today.  Concerns regarding medicines are outlined above.  No orders of the defined types were placed in this encounter.  No orders of the defined types were placed in this encounter.    History of Present Illness:    Sara Mercer is a 53 y.o. female who is being  seen today for the evaluation of chest tightness at the request of Jearld Fenton, NP.  Patient is a pleasant 53 year old female.  She has past medical history of mild dyslipidemia.  The chart mentions diabetes mellitus.  She has elevated hemoglobin A1c done last year available in the record.  Patient is a Designer, jewellery and otherwise in good health.  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  She went to the emergency room because chest tightness that has been persisting for the past several days.  There is no radiation of this to the neck or to the arms.  She has worked after hospital discharge with not much of an issue.  She was sent here for evaluation because of chest tightness with multiple risk factors.  She is a cheerful lady.  At the time of my evaluation, the patient is alert awake oriented and in no distress.  No history of any known coronary problems in the past.  No history of dizziness or any syncope.  Past Medical History:  Diagnosis Date  . Allergy   . Anemia   . Chicken pox   . 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 GERD (gastroesophageal reflux disease)   . Positive TB test   . Thalassemia minor     Past Surgical History:  Procedure Laterality Date  . BREAST BIOPSY Right 08/2011   negative results  . CHOLECYSTECTOMY  1992  .  WISDOM TOOTH EXTRACTION      Current Medications: Current Meds  Medication Sig  . b complex vitamins tablet Take 1 tablet by mouth daily.  . Cholecalciferol (VITAMIN D) 2000 units tablet Take 2,000 Units by mouth daily.  . fexofenadine (ALLEGRA) 180 MG tablet Take 1 tablet (180 mg total) by mouth daily. (Patient taking differently: Take 180 mg by mouth as needed. )  . fluticasone (FLONASE) 50 MCG/ACT nasal spray Place 2 sprays into both nostrils daily.  Marland Kitchen ibuprofen (ADVIL,MOTRIN) 200 MG tablet Take 800 mg by mouth as needed.  . Multiple Vitamin (MULTIVITAMIN) tablet Take 1 tablet by mouth daily.    . nitroGLYCERIN (NITROSTAT) 0.4 MG SL tablet Place 1 tablet (0.4 mg total) under the tongue every 5 (five) minutes as needed for chest pain.  Marland Kitchen omeprazole (PRILOSEC) 20 MG capsule Take 20 mg by mouth as needed.      Allergies:   Patient has no known allergies.   Social History   Socioeconomic History  . Marital status: Divorced    Spouse name: Not on file  . Number of children: Not on file  . Years of education: Not on file  . Highest education level: Not on file  Occupational History  . Not on file  Social Needs  . Financial resource strain: Not on file  . Food insecurity:    Worry: Not on file    Inability: Not on file  . Transportation needs:    Medical: Not on file    Non-medical: Not on file  Tobacco Use  . Smoking status: Never Smoker  . Smokeless tobacco: Never Used  Substance and Sexual Activity  . Alcohol use: Yes    Alcohol/week: 0.0 standard drinks    Comment: occasional  . Drug use: No  . Sexual activity: Not Currently    Birth control/protection: None  Lifestyle  . Physical activity:    Days per week: Not on file    Minutes per session: Not on file  . Stress: Not on file  Relationships  . Social connections:    Talks on phone: Not on file    Gets together: Not on file    Attends religious service: Not on file    Active member of club or organization: Not on file    Attends meetings of clubs or organizations: Not on file    Relationship status: Not on file  Other Topics Concern  . Not on file  Social History Narrative  . Not on file     Family History: The patient's family history includes Diabetes in her maternal grandfather and mother; Glaucoma in her maternal grandfather and mother; Heart disease in her maternal grandmother; Hypertension in her father, maternal grandfather, and maternal grandmother; Stroke in her father. There is no history of Cancer.  ROS:   Please see the history of present illness.    All other systems reviewed and are  negative.  EKGs/Labs/Other Studies Reviewed:    The following studies were reviewed today: EKG was reviewed and reveals sinus bradycardia and nonspecific ST-T changes   Recent Labs: 08/20/2017: ALT 27; TSH 1.30 06/27/2018: BUN 12; Creatinine, Ser 0.77; Hemoglobin 11.2; Platelets 277; Potassium 4.2; Sodium 142  Recent Lipid Panel    Component Value Date/Time   CHOL 200 08/20/2017 1235   TRIG 129.0 08/20/2017 1235   HDL 56.60 08/20/2017 1235   CHOLHDL 4 08/20/2017 1235   VLDL 25.8 08/20/2017 1235   LDLCALC 118 (H) 08/20/2017 1235    Physical  Exam:    VS:  BP 130/72 (BP Location: Right Arm, Patient Position: Sitting, Cuff Size: Normal)   Pulse (!) 52   Ht 5' 4.5" (1.638 m)   Wt 198 lb (89.8 kg)   SpO2 99%   BMI 33.46 kg/m     Wt Readings from Last 3 Encounters:  06/30/18 198 lb (89.8 kg)  06/29/18 199 lb (90.3 kg)  08/28/17 206 lb (93.4 kg)     GEN: Patient is in no acute distress HEENT: Normal NECK: No JVD; No carotid bruits LYMPHATICS: No lymphadenopathy CARDIAC: S1 S2 regular, 2/6 systolic murmur at the apex. RESPIRATORY:  Clear to auscultation without rales, wheezing or rhonchi  ABDOMEN: Soft, non-tender, non-distended MUSCULOSKELETAL:  No edema; No deformity  SKIN: Warm and dry NEUROLOGIC:  Alert and oriented x 3 PSYCHIATRIC:  Normal affect    Signed, Jenean Lindau, MD  06/30/2018 11:43 AM    [facility]

## 2018-06-30 NOTE — Patient Instructions (Signed)
Medication Instructions:  Your physician recommends that you continue on your current medications as directed. Please refer to the Current Medication list given to you today.  If you need a refill on your cardiac medications before your next appointment, please call your pharmacy.   Lab work: None  If you have labs (blood work) drawn today and your tests are completely normal, you will receive your results only by: Marland Kitchen MyChart Message (if you have MyChart) OR . A paper copy in the mail If you have any lab test that is abnormal or we need to change your treatment, we will call you to review the results.  Testing/Procedures: Your physician has requested that you have an echocardiogram. Echocardiography is a painless test that uses sound waves to create images of your heart. It provides your doctor with information about the size and shape of your heart and how well your heart's chambers and valves are working. This procedure takes approximately one hour. There are no restrictions for this procedure.  Your physician has requested that you have en exercise stress myoview. For further information please visit [email or web address]. Please follow instruction sheet, as given.  Follow-Up: At [facility], you and your health needs are our priority.  As part of our continuing mission to provide you with exceptional heart care, we have created designated Provider Care Teams.  These Care Teams include your primary Cardiologist (physician) and Advanced Practice Providers (APPs -  Physician Assistants and Nurse Practitioners) who all work together to provide you with the care you need, when you need it. You will need a follow up appointment in 6 months.  Please call our office 2 months in advance to schedule this appointment.     Echocardiogram An echocardiogram is a procedure that uses painless sound waves (ultrasound) to produce an image of the heart. Images from an echocardiogram can provide important  information about:  Signs of coronary artery disease (CAD).  Aneurysm detection. An aneurysm is a weak or damaged part of an artery wall that bulges out from the normal force of blood pumping through the body.  Heart size and shape. Changes in the size or shape of the heart can be associated with certain conditions, including heart failure, aneurysm, and CAD.  Heart muscle function.  Heart valve function.  Signs of a past heart attack.  Fluid buildup around the heart.  Thickening of the heart muscle.  A tumor or infectious growth around the heart valves. Tell a health care provider about:  Any allergies you have.  All medicines you are taking, including vitamins, herbs, eye drops, creams, and over-the-counter medicines.  Any blood disorders you have.  Any surgeries you have had.  Any medical conditions you have.  Whether you are pregnant or may be pregnant. What are the risks? Generally, this is a safe procedure. However, problems may occur, including:  Allergic reaction to dye (contrast) that may be used during the procedure. What happens before the procedure? No specific preparation is needed. You may eat and drink normally. What happens during the procedure?   An IV tube may be inserted into one of your veins.  You may receive contrast through this tube. A contrast is an injection that improves the quality of the pictures from your heart.  A gel will be applied to your chest.  A wand-like tool (transducer) will be moved over your chest. The gel will help to transmit the sound waves from the transducer.  The sound waves will harmlessly bounce off  of your heart to allow the heart images to be captured in real-time motion. The images will be recorded on a computer. The procedure may vary among health care providers and hospitals. What happens after the procedure?  You may return to your normal, everyday life, including diet, activities, and medicines, unless your  health care provider tells you not to do that. Summary  An echocardiogram is a procedure that uses painless sound waves (ultrasound) to produce an image of the heart.  Images from an echocardiogram can provide important information about the size and shape of your heart, heart muscle function, heart valve function, and fluid buildup around your heart.  You do not need to do anything to prepare before this procedure. You may eat and drink normally.  After the echocardiogram is completed, you may return to your normal, everyday life, unless your health care provider tells you not to do that. This information is not intended to replace advice given to you by your health care provider. Make sure you discuss any questions you have with your health care provider. Document Released: 05/09/2000 Document Revised: 06/14/2016 Document Reviewed: 06/14/2016 Elsevier Interactive Patient Education  2019 [employer].   Exercise Stress Test An exercise stress test is a test to check how your heart works during exercise. You will need to walk on a treadmill or ride an exercise bike for this test. An electrocardiogram (ECG) will record your heartbeat when you are at rest and when you are exercising. You may have an ultrasound or nuclear test after the exercise test. The test is done to check for coronary artery disease (CAD). It is also done to:  See how well you can exercise.  Watch for high blood pressure during exercise.  Test how well you can exercise after treatment.  Check the blood flow to your arms and legs. If your test result is not normal, more testing may be needed. What happens before the procedure?  Follow instructions from your doctor about what you cannot eat or drink. ? Do not have any drinks or foods that have caffeine in them for 24 hours before the test, or as told by your doctor. This includes coffee, tea (even decaf tea), sodas, chocolate, and cocoa.  Ask your doctor about  changing or stopping your normal medicines. This is important if you: ? Take diabetes medicines. ? Take beta-blocker medicines. ? Wear a nitroglycerin patch.  If you use an inhaler, bring it with you to the test.  Do not put lotions, powders, creams, or oils on your chest before the test.  Wear comfortable shoes and clothing.  Do not use any products that have nicotine or tobacco in them, such as cigarettes and e-cigarettes. Stop using them at least 4 hours before the test. If you need help quitting, ask your doctor. What happens during the procedure?  Patches (electrodes) will be put on your chest.  Wires will be connected to the patches. The wires will send signals to a machine to record your heartbeat.  Your heart rate will be watched while you are resting and while you are exercising. Your blood pressure will also be watched during the test.  You will walk on a treadmill or use a stationary bike. If you cannot use these, you may be asked to turn a crank with your hands.  The activity will get harder and will raise your heart rate.  You may be asked to breathe into a tube a few times during the test. This  measures the gases that you breathe out.  You will be asked how you are feeling throughout the test.  You will exercise until your heart reaches a target heart rate. You will stop early if: ? You feel dizzy. ? You have chest pain. ? You are out of breath. ? Your blood pressure is too high or too low. ? You have an irregular heartbeat. ? You have pain or aching in your arms or legs. The procedure may vary among doctors and hospitals. What happens after the procedure?  Your blood pressure, heart rate, breathing rate, and blood oxygen level will be watched after the test.  You may return to your normal diet and activities as told by your doctor.  It is up to you to get the results of your test. Ask your doctor, or the department that is doing the test, when your results  will be ready. Summary  An exercise stress test is a test to check how your heart works during exercise.  This test is done to check for coronary artery disease.  Your heart rate will be watched while you are resting and while you are exercising.  Follow instructions from your doctor about what you cannot eat or drink before the test. This information is not intended to replace advice given to you by your health care provider. Make sure you discuss any questions you have with your health care provider. Document Released: 10/29/2007 Document Revised: 08/12/2016 Document Reviewed: 08/12/2016 Elsevier Interactive Patient Education  2019 [employer].

## 2018-06-30 NOTE — Telephone Encounter (Signed)
Encounter complete. 

## 2018-07-01 ENCOUNTER — Ambulatory Visit (HOSPITAL_COMMUNITY)
Admission: RE | Admit: 2018-07-01 | Discharge: 2018-07-01 | Disposition: A | Discharge status: Home / Self Care | Payer: PRIVATE HEALTH INSURANCE | Source: Ambulatory Visit | Attending: Internal Medicine | Admitting: Internal Medicine

## 2018-07-01 ENCOUNTER — Telehealth: Payer: Self-pay

## 2018-07-01 DIAGNOSIS — R0789 Other chest pain: Secondary | ICD-10-CM | POA: Diagnosis not present

## 2018-07-01 LAB — MYOCARDIAL PERFUSION IMAGING
CSEPED: 10 min
Estimated workload: 11.5 METS
Exercise duration (sec): 34 s
LV dias vol: 87 mL (ref 46–106)
LV sys vol: 34 mL
MPHR: 169 {beats}/min
Peak HR: 150 {beats}/min
Percent HR: 88 %
RPE: 19
Rest HR: 43 {beats}/min
SDS: 3
SRS: 6
SSS: 9
TID: 1.22

## 2018-07-01 MED ORDER — TECHNETIUM TC 99M TETROFOSMIN IV KIT
31.8000 | PACK | Freq: Once | INTRAVENOUS | Status: AC | PRN
  Administered 2018-07-01: 31.8 via INTRAVENOUS
  Filled 2018-07-01: qty 32

## 2018-07-01 MED ORDER — TECHNETIUM TC 99M TETROFOSMIN IV KIT
9.1000 | PACK | Freq: Once | INTRAVENOUS | Status: AC | PRN
  Administered 2018-07-01: 9.1 via INTRAVENOUS
  Filled 2018-07-01: qty 10

## 2018-07-01 NOTE — Telephone Encounter (Signed)
-----   Message from Jenean Lindau, MD sent at 07/01/2018  2:19 PM EST ----- The results of the study is unremarkable. Please inform patient. I will discuss in detail at next appointment. Cc  primary care/referring physician Jenean Lindau, MD 07/01/2018 2:19 PM

## 2018-07-01 NOTE — Telephone Encounter (Signed)
Patient called and notified of test results. 

## 2018-07-01 NOTE — Telephone Encounter (Signed)
-----   Message from Jenean Lindau, MD sent at 07/01/2018  9:05 AM EST ----- The results of the study is unremarkable. Please inform patient. I will discuss in detail at next appointment. Cc  primary care/referring physician Jenean Lindau, MD 07/01/2018 9:05 AM

## 2018-07-03 ENCOUNTER — Other Ambulatory Visit: Payer: Self-pay

## 2018-07-03 ENCOUNTER — Emergency Department (HOSPITAL_BASED_OUTPATIENT_CLINIC_OR_DEPARTMENT_OTHER)
Admission: EM | Admit: 2018-07-03 | Discharge: 2018-07-04 | Disposition: A | Discharge status: Home / Self Care | Payer: PRIVATE HEALTH INSURANCE | Attending: Emergency Medicine | Admitting: Emergency Medicine

## 2018-07-03 DIAGNOSIS — T7840XA Allergy, unspecified, initial encounter: Secondary | ICD-10-CM | POA: Diagnosis not present

## 2018-07-03 DIAGNOSIS — Z79899 Other long term (current) drug therapy: Secondary | ICD-10-CM | POA: Insufficient documentation

## 2018-07-03 DIAGNOSIS — R220 Localized swelling, mass and lump, head: Secondary | ICD-10-CM | POA: Diagnosis not present

## 2018-07-03 DIAGNOSIS — R21 Rash and other nonspecific skin eruption: Secondary | ICD-10-CM | POA: Insufficient documentation

## 2018-07-03 DIAGNOSIS — E119 Type 2 diabetes mellitus without complications: Secondary | ICD-10-CM | POA: Diagnosis not present

## 2018-07-03 MED ORDER — EPINEPHRINE 0.3 MG/0.3ML IJ SOAJ: 0.3000 mg | INTRAMUSCULAR | 0 refills | Status: DC | PRN

## 2018-07-03 MED ORDER — EPINEPHRINE 0.15 MG/0.3ML IJ SOAJ
INTRAMUSCULAR | Status: DC
Start: 2018-07-03 — End: 2018-07-04
  Filled 2018-07-03: qty 0.3

## 2018-07-03 MED ORDER — FAMOTIDINE IN NACL 20-0.9 MG/50ML-% IV SOLN
20.0000 mg | Freq: Once | INTRAVENOUS | Status: AC
  Administered 2018-07-03: 20 mg via INTRAVENOUS
  Filled 2018-07-03: qty 50

## 2018-07-03 MED ORDER — METHYLPREDNISOLONE SODIUM SUCC 125 MG IJ SOLR
125.0000 mg | Freq: Once | INTRAMUSCULAR | Status: AC
  Administered 2018-07-03: 125 mg via INTRAVENOUS
  Filled 2018-07-03: qty 2

## 2018-07-03 MED ORDER — EPINEPHRINE 0.3 MG/0.3ML IJ SOAJ
0.3000 mg | Freq: Once | INTRAMUSCULAR | Status: AC
  Administered 2018-07-03: 0.3 mg via INTRAMUSCULAR
  Filled 2018-07-03: qty 0.3

## 2018-07-03 MED ORDER — DIPHENHYDRAMINE HCL 25 MG PO CAPS: 50.0000 mg | ORAL_CAPSULE | Freq: Once | ORAL | Status: DC

## 2018-07-03 MED ORDER — DIPHENHYDRAMINE HCL 50 MG/ML IJ SOLN
50.0000 mg | Freq: Once | INTRAMUSCULAR | Status: AC
  Administered 2018-07-03: 50 mg via INTRAVENOUS
  Filled 2018-07-03: qty 1

## 2018-07-03 MED ORDER — PREDNISONE 50 MG PO TABS: 60.0000 mg | ORAL_TABLET | Freq: Once | ORAL | Status: DC

## 2018-07-03 MED ORDER — PREDNISONE 20 MG PO TABS: ORAL_TABLET | ORAL | 0 refills | Status: DC

## 2018-07-03 MED ORDER — FAMOTIDINE 20 MG PO TABS: 40.0000 mg | ORAL_TABLET | Freq: Once | ORAL | Status: DC

## 2018-07-03 NOTE — ED Notes (Signed)
ED Provider at bedside. 

## 2018-07-03 NOTE — ED Notes (Signed)
Pt states she feels unable to swallow pills. EDP notified. Orders changed to IV.

## 2018-07-03 NOTE — ED Triage Notes (Signed)
Pt presents with tongue swelling and hives to face after eating at [employer]. Pt able to control secretions and speak in complete sentences. No known allergies

## 2018-07-03 NOTE — Discharge Instructions (Signed)
Take benadryl or zyrtec for the next three days consistently.  Return to the ED for recurrent symptoms.  In the medical literature there is thing is a rebound allergic reaction which can happen at any time within the next couple weeks without re-exposure to what ever you are allergic to.  Please get your prescription filled for your epinephrine pen.  Call your family doctor on Monday and discuss your visit here and see when they want to see you in the office.

## 2018-07-03 NOTE — ED Provider Notes (Signed)
[facility] EMERGENCY DEPARTMENT Provider Note   CSN: [id number] Arrival date & time: 07/03/18  2116     History   Chief Complaint Chief Complaint  Patient presents with  . Allergic Reaction    HPI Sara Mercer is a 53 y.o. female.  53 yo F with a chief complaint of an allergic reaction.  This started just prior to arrival.  The patient was eating at a [employer] and had shellfish and vegetables then started having swelling to her tongue and her lips and a rash broke out to her face and in her chest and arms.  She denied any difficulty breathing or wheezing denied vomiting or diarrhea denied feeling lightheaded or like she may pass out.  She is never had an allergic reaction like this before.  No known allergies to anything.  The history is provided by the patient.  Allergic Reaction  Presenting symptoms: difficulty swallowing and rash   Presenting symptoms: no wheezing     Past Medical History:  Diagnosis Date  . Allergy   . Anemia   . Chicken pox   . 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 GERD (gastroesophageal reflux disease)   . Positive TB test   . Thalassemia minor     Patient Active Problem List   Diagnosis Date Noted  . Chest tightness 06/30/2018  . Mixed dyslipidemia 06/30/2018  . BMI 33.0-33.9,adult 06/30/2018  . Type 2 diabetes mellitus without complication, without long-term current use of insulin ([place]) 09/03/2017  . Thalassemia minor 07/30/2015  . GERD (gastroesophageal reflux disease) 07/30/2015  . Seasonal allergies 07/30/2015    Past Surgical History:  Procedure Laterality Date  . BREAST BIOPSY Right 08/2011   negative results  . CHOLECYSTECTOMY  1992  . WISDOM TOOTH EXTRACTION       OB History    Gravida  1   Para  0   Term      Preterm      AB  1   Living  0     SAB  1   TAB      Ectopic      Multiple      Live Births               Home Medications    Prior to Admission  medications   Medication Sig Start Date End Date Taking? Authorizing Provider  b complex vitamins tablet Take 1 tablet by mouth daily.    [provider]  Cholecalciferol (VITAMIN D) 2000 units tablet Take 2,000 Units by mouth daily.    [provider]  EPINEPHrine (EPIPEN 2-PAK) 0.3 mg/0.3 mL IJ SOAJ injection Inject 0.3 mLs (0.3 mg total) into the muscle as needed for anaphylaxis. 07/03/18   Deno Etienne, DO  fexofenadine (ALLEGRA) 180 MG tablet Take 1 tablet (180 mg total) by mouth daily. Patient taking differently: Take 180 mg by mouth as needed.  07/30/15   Jearld Fenton, NP  fluticasone (FLONASE) 50 MCG/ACT nasal spray Place 2 sprays into both nostrils daily. 07/30/15   Jearld Fenton, NP  ibuprofen (ADVIL,MOTRIN) 200 MG tablet Take 800 mg by mouth as needed.    [provider]  Multiple Vitamin (MULTIVITAMIN) tablet Take 1 tablet by mouth daily.    [provider]  nitroGLYCERIN (NITROSTAT) 0.4 MG SL tablet Place 1 tablet (0.4 mg total) under the tongue every 5 (five) minutes as needed for chest pain. 06/29/18   Webb Silversmith  W, NP  omeprazole (PRILOSEC) 20 MG capsule Take 20 mg by mouth as needed.  09/28/17   [provider]  predniSONE (DELTASONE) 20 MG tablet 2 tabs po daily x 4 days 07/03/18   Deno Etienne, DO    Family History Family History  Problem Relation Age of Onset  . Hypertension Maternal Grandmother   . Heart disease Maternal Grandmother        heart failure  . Glaucoma Maternal Grandfather   . Diabetes Maternal Grandfather   . Hypertension Maternal Grandfather   . Hypertension Father   . Stroke Father   . Diabetes Mother   . Glaucoma Mother   . Cancer Neg Hx     Social History Social History   Tobacco Use  . Smoking status: Never Smoker  . Smokeless tobacco: Never Used  Substance Use Topics  . Alcohol use: Yes    Alcohol/week: 0.0 standard drinks    Comment: occasional  . Drug use: No     Allergies   Patient has no  known allergies.   Review of Systems Review of Systems  Constitutional: Negative for chills and fever.  HENT: Positive for facial swelling and trouble swallowing. Negative for congestion and rhinorrhea.   Eyes: Negative for redness and visual disturbance.  Respiratory: Negative for shortness of breath and wheezing.   Cardiovascular: Negative for chest pain and palpitations.  Gastrointestinal: Negative for nausea and vomiting.  Genitourinary: Negative for dysuria and urgency.  Musculoskeletal: Negative for arthralgias and myalgias.  Skin: Positive for rash. Negative for pallor and wound.  Neurological: Negative for dizziness and headaches.     Physical Exam Updated Vital Signs Pulse 77   Resp 14   Ht 5\' 5"  (1.651 m)   Wt 89.8 kg   SpO2 98%   BMI 32.95 kg/m   Physical Exam Vitals signs and nursing note reviewed.  Constitutional:      General: She is not in acute distress.    Appearance: She is well-developed. She is not diaphoretic.  HENT:     Head: Normocephalic and atraumatic.     Comments: No appreciable lip swelling, patient has hives present about her face worse around her mouth, mild tongue swelling.  Tolerating secretions without difficulty. Eyes:     Pupils: Pupils are equal, round, and reactive to light.  Neck:     Musculoskeletal: Normal range of motion and neck supple.  Cardiovascular:     Rate and Rhythm: Normal rate and regular rhythm.     Heart sounds: No murmur. No friction rub. No gallop.   Pulmonary:     Effort: Pulmonary effort is normal.     Breath sounds: No wheezing or rales.  Abdominal:     General: There is no distension.     Palpations: Abdomen is soft.     Tenderness: There is no abdominal tenderness.  Musculoskeletal:        General: No tenderness.  Skin:    General: Skin is warm and dry.     Comments: Diffuse hives  Neurological:     Mental Status: She is alert and oriented to person, place, and time.  Psychiatric:        Behavior:  Behavior normal.      ED Treatments / Results  Labs (all labs ordered are listed, but only abnormal results are displayed) Labs Reviewed - No data to display  EKG None  Radiology No results found.  Procedures Procedures (including critical care time)  Medications Ordered in ED  Medications  EPINEPHrine (EPIPEN JR) 0.15 MG/0.3ML injection (  Not Given 07/03/18 2146)  methylPREDNISolone sodium succinate (SOLU-MEDROL) 125 mg/2 mL injection 125 mg (125 mg Intravenous Given 07/03/18 2129)  diphenhydrAMINE (BENADRYL) injection 50 mg (50 mg Intravenous Given 07/03/18 2129)  famotidine (PEPCID) IVPB 20 mg premix (0 mg Intravenous Stopped 07/03/18 2223)  EPINEPHrine (EPI-PEN) injection 0.3 mg (0.3 mg Intramuscular Given 07/03/18 2133)     Initial Impression / Assessment and Plan / ED Course  I have reviewed the triage vital signs and the nursing notes.  Pertinent labs & imaging results that were available during my care of the patient were reviewed by me and considered in my medical decision making (see chart for details).     53 yo F with a chief complaint of an allergic reaction.  This started just prior to arrival.  Patient is tachycardic on arrival into the 120s.  She has clear lung sounds no abdominal tenderness her blood pressure is hypertensive.  The patient appears somewhat anxious about her symptoms.  I offered her p.o. meds that she is tolerating secretions without difficulty and has no appreciable intraoral swelling, the patient felt that she was unable to tolerate oral medications and so is requesting them to be placed through the IV, she had had mild worsening of her rash since arrival and she is requesting an EpiPen because she feels that her throat is swelling.  Patient was reassessed and feeling much better.  The rash is most completely resolved.  Tachycardia has also improved significantly.  Will observe the patient in the ED.  The patient was observed in the ED for 2 hours with  complete resolution of her symptoms.  At this point the patient is electing to be discharged.  We will give her a prescription for an epinephrine pen and a burst dose of steroids.  We will have her follow-up with her family doctor.  CRITICAL CARE Performed by: Cecilio Asper   Total critical care time: 35 minutes  Critical care time was exclusive of separately billable procedures and treating other patients.  Critical care was necessary to treat or prevent imminent or life-threatening deterioration.  Critical care was time spent personally by me on the following activities: development of treatment plan with patient and/or surrogate as well as nursing, discussions with consultants, evaluation of patient's response to treatment, examination of patient, obtaining history from patient or surrogate, ordering and performing treatments and interventions, ordering and review of laboratory studies, ordering and review of radiographic studies, pulse oximetry and re-evaluation of patient's condition.  The patients results and plan were reviewed and discussed.   Any x-rays performed were independently reviewed by myself.   Differential diagnosis were considered with the presenting HPI.  Medications  EPINEPHrine (EPIPEN JR) 0.15 MG/0.3ML injection (  Not Given 07/03/18 2146)  methylPREDNISolone sodium succinate (SOLU-MEDROL) 125 mg/2 mL injection 125 mg (125 mg Intravenous Given 07/03/18 2129)  diphenhydrAMINE (BENADRYL) injection 50 mg (50 mg Intravenous Given 07/03/18 2129)  famotidine (PEPCID) IVPB 20 mg premix (0 mg Intravenous Stopped 07/03/18 2223)  EPINEPHrine (EPI-PEN) injection 0.3 mg (0.3 mg Intramuscular Given 07/03/18 2133)    Vitals:   07/03/18 2119 07/03/18 2216  Pulse:  77  Resp:  14  SpO2:  98%  Weight: 89.8 kg   Height: 5\' 5"  (1.651 m)     Final diagnoses:  Allergic reaction, initial encounter    Admission/ observation were discussed with the admitting physician, patient  and/or family and they are comfortable  with the plan.    Final Clinical Impressions(s) / ED Diagnoses   Final diagnoses:  Allergic reaction, initial encounter    ED Discharge Orders         Ordered    EPINEPHrine (EPIPEN 2-PAK) 0.3 mg/0.3 mL IJ SOAJ injection  As needed     07/03/18 2343    predniSONE (DELTASONE) 20 MG tablet     07/03/18 2343           Deno Etienne, DO 07/03/18 2348

## 2018-07-04 ENCOUNTER — Encounter: Payer: Self-pay | Admitting: Internal Medicine

## 2018-07-04 NOTE — Patient Instructions (Signed)

## 2018-07-04 NOTE — Progress Notes (Signed)
Subjective:    Patient ID: Sara Mercer, female    DOB: 12-29-1965, 53 y.o.   MRN: [id number]  HPI  Pt presents to the clinic today for ER Follow Up. She went to the ER 06/27/18 with c/o left sided chest pain, left arm pain and paresthesia of the jaw. Chest xray was negative . ECG was normal. Troponin's were negative. They offered her admission for observation but she declined. She was discharged and advised to follow up with PCP and cardiology. Since discharge, she reports the pain is unchanged. She describes the pain as tightness with pressure. The pain radiates into the left shoulder and down the left arm. She has numbness and tingling in the left jaw and left hand. She denies weakness. She reports some shortness of breath and anxiety. She has had reflux in the past but reports this feels different.  She has not taken anything OTC for her symptoms.  Review of Systems      Past Medical History:  Diagnosis Date  . Allergy   . Anemia   . Chicken pox   . 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 GERD (gastroesophageal reflux disease)   . Positive TB test   . Thalassemia minor     Current Outpatient Medications  Medication Sig Dispense Refill  . omeprazole (PRILOSEC) 20 MG capsule Take 20 mg by mouth as needed.     Marland Kitchen b complex vitamins tablet Take 1 tablet by mouth daily.    . Cholecalciferol (VITAMIN D) 2000 units tablet Take 2,000 Units by mouth daily.    Marland Kitchen EPINEPHrine (EPIPEN 2-PAK) 0.3 mg/0.3 mL IJ SOAJ injection Inject 0.3 mLs (0.3 mg total) into the muscle as needed for anaphylaxis. 1 Device 0  . fexofenadine (ALLEGRA) 180 MG tablet Take 1 tablet (180 mg total) by mouth daily. (Patient taking differently: Take 180 mg by mouth as needed. ) 30 tablet 5  . fluticasone (FLONASE) 50 MCG/ACT nasal spray Place 2 sprays into both nostrils daily. 16 g 5  . ibuprofen (ADVIL,MOTRIN) 200 MG tablet Take 800 mg by mouth as needed.    . Multiple Vitamin (MULTIVITAMIN) tablet Take 1  tablet by mouth daily.    . nitroGLYCERIN (NITROSTAT) 0.4 MG SL tablet Place 1 tablet (0.4 mg total) under the tongue every 5 (five) minutes as needed for chest pain. 50 tablet 3  . predniSONE (DELTASONE) 20 MG tablet 2 tabs po daily x 4 days 8 tablet 0   No current facility-administered medications for this visit.     No Known Allergies  Family History  Problem Relation Age of Onset  . Hypertension Maternal Grandmother   . Heart disease Maternal Grandmother        heart failure  . Glaucoma Maternal Grandfather   . Diabetes Maternal Grandfather   . Hypertension Maternal Grandfather   . Hypertension Father   . Stroke Father   . Diabetes Mother   . Glaucoma Mother   . Cancer Neg Hx     Social History   Socioeconomic History  . Marital status: Divorced    Spouse name: Not on file  . Number of children: Not on file  . Years of education: Not on file  . Highest education level: Not on file  Occupational History  . Not on file  Social Needs  . Financial resource strain: Not on file  . Food insecurity:    Worry: Not on file    Inability: Not on file  .  Transportation needs:    Medical: Not on file    Non-medical: Not on file  Tobacco Use  . Smoking status: Never Smoker  . Smokeless tobacco: Never Used  Substance and Sexual Activity  . Alcohol use: Yes    Alcohol/week: 0.0 standard drinks    Comment: occasional  . Drug use: No  . Sexual activity: Not Currently    Birth control/protection: None  Lifestyle  . Physical activity:    Days per week: Not on file    Minutes per session: Not on file  . Stress: Not on file  Relationships  . Social connections:    Talks on phone: Not on file    Gets together: Not on file    Attends religious service: Not on file    Active member of club or organization: Not on file    Attends meetings of clubs or organizations: Not on file    Relationship status: Not on file  . Intimate partner violence:    Fear of current or ex  partner: Not on file    Emotionally abused: Not on file    Physically abused: Not on file    Forced sexual activity: Not on file  Other Topics Concern  . Not on file  Social History Narrative  . Not on file     Constitutional: Denies fever, malaise, fatigue, headache or abrupt weight changes.  HEENT: Denies eye pain, eye redness, ear pain, ringing in the ears, wax buildup, runny nose, nasal congestion, bloody nose, or sore throat. Respiratory: Pt reports intermittent shortness of breath. Denies difficulty breathing, cough or sputum production.   Cardiovascular: Pt reports chest pain and tightness. Denies palpitations or swelling in the hands or feet.  Gastrointestinal: Denies abdominal pain, bloating, constipation, diarrhea or blood in the stool.  Musculoskeletal: Denies decrease in range of motion, difficulty with gait, muscle pain or joint pain and swelling.  Skin: Denies redness, rashes, lesions or ulcercations.  Neurological: Denies dizziness, difficulty with memory, difficulty with speech or problems with balance and coordination.  Psych: Pt reports anxiety. Denies depression, SI/HI.  No other specific complaints in a complete review of systems (except as listed in HPI above).  Objective:   Physical Exam  BP 136/76   Pulse (!) 50   Temp 98.2 F (36.8 C) (Oral)   Wt 199 lb (90.3 kg)   SpO2 97%   BMI 33.63 kg/m  Wt Readings from Last 3 Encounters:  07/03/18 198 lb (89.8 kg)  07/01/18 198 lb (89.8 kg)  06/30/18 198 lb (89.8 kg)    General: Appears her stated age, obese, in NAD. Skin: Warm, dry and intact. No rashes noted. Cardiovascular: Normal rate and rhythm. S1,S2 noted.  No murmur, rubs or gallops noted. No JVD or BLE edema. No carotid bruits noted. Pulmonary/Chest: Normal effort and positive vesicular breath sounds. No respiratory distress. No wheezes, rales or ronchi noted.  Abdomen: Soft and nontender. Normal bowel sounds.  Musculoskeletal: Chest wall nontender  with palpation. Neurological: Alert and oriented.  Psychiatric: Mood and affect normal. Behavior is normal. Judgment and thought content normal.    BMET    Component Value Date/Time   NA 142 06/27/2018 0950   K 4.2 06/27/2018 0950   CL 108 06/27/2018 0950   CO2 28 06/27/2018 0950   GLUCOSE 86 06/27/2018 0950   BUN 12 06/27/2018 0950   CREATININE 0.77 06/27/2018 0950   CALCIUM 9.3 06/27/2018 0950   GFRNONAA >60 06/27/2018 0950   GFRAA >60 06/27/2018  5974    Lipid Panel     Component Value Date/Time   CHOL 200 08/20/2017 1235   TRIG 129.0 08/20/2017 1235   HDL 56.60 08/20/2017 1235   CHOLHDL 4 08/20/2017 1235   VLDL 25.8 08/20/2017 1235   LDLCALC 118 (H) 08/20/2017 1235    CBC    Component Value Date/Time   WBC 7.2 06/27/2018 0950   RBC 5.00 06/27/2018 0950   HGB 11.2 (L) 06/27/2018 0950   HCT 36.7 06/27/2018 0950   PLT 277 06/27/2018 0950   MCV 73.4 (L) 06/27/2018 0950   MCH 22.4 (L) 06/27/2018 0950   MCHC 30.5 06/27/2018 0950   RDW 15.8 (H) 06/27/2018 0950   LYMPHSABS 2.6 06/20/2016 1412   MONOABS 1.0 06/20/2016 1412   EOSABS 0.0 06/20/2016 1412   BASOSABS 0.0 06/20/2016 1412    Hgb A1C Lab Results  Component Value Date   HGBA1C 6.6 (H) 01/08/2018             Assessment & Plan:   ER Follow Up for Chest Pain:  ER notes, labs and imaging reviewed Does not appear hear related but given persistent symptoms, will refer to cardiology for stress test RX for Nitro 0.4 mg SL- instructions provided Will check amylase, lipase, H Pylori, HFP and D Dimer Continue other meds as needed  ER/Return precautions discussed Webb Silversmith, NP

## 2018-07-08 ENCOUNTER — Ambulatory Visit: Payer: PRIVATE HEALTH INSURANCE | Admitting: Internal Medicine

## 2018-07-09 ENCOUNTER — Ambulatory Visit: Payer: PRIVATE HEALTH INSURANCE | Admitting: Allergy

## 2018-07-09 ENCOUNTER — Encounter: Payer: Self-pay | Admitting: Allergy

## 2018-07-09 ENCOUNTER — Encounter: Payer: Self-pay | Admitting: Internal Medicine

## 2018-07-09 VITALS — BP 132/90 | HR 98 | Temp 97.8°F | Resp 20 | Ht 64.8 in | Wt 198.4 lb

## 2018-07-09 DIAGNOSIS — J309 Allergic rhinitis, unspecified: Secondary | ICD-10-CM | POA: Diagnosis not present

## 2018-07-09 DIAGNOSIS — Z1211 Encounter for screening for malignant neoplasm of colon: Secondary | ICD-10-CM

## 2018-07-09 DIAGNOSIS — R079 Chest pain, unspecified: Secondary | ICD-10-CM

## 2018-07-09 DIAGNOSIS — T7840XD Allergy, unspecified, subsequent encounter: Secondary | ICD-10-CM

## 2018-07-09 NOTE — Patient Instructions (Addendum)
Allergic reaction    - allergic reaction after eating [employer] and chicken.  Foods that you eat less commonly will obtain serum IgE levels (shellfish panel, alpha gal panel, cayenne).  You eat chicken, rice, onions, zucchini relatively regularly thus these are less likely to be culprits.     - will also obtain tryptase level to screen for mast cell (allergy cell) activation.  This will be your baseline level.  Will provide with tryptase lab to keep on hand if you have another reaction to have the lab drawn within 4-6 hours onset of reaction.   Tryptase should be elevated acutely during IgE mediated reactions.     - your symptoms have resolved thus ok to stop antihistamines.      - have access to self-injectable epinephrine (Epipen or AuviQ) 0.3mg  at all times   - follow emergency action plan in case of allergic reaction     Seasonal allergies   - continue flonase 2 sprays each nostril daily as needed for congestion. Use for 1-2 weeks at a time before stopping once symptoms improve.    - allegra 180mg  daily as needed for general allergy symptoms  - let us know if you would like to have environmental allergy testing in the future  Follow-up 3-4 months or sooner if needed

## 2018-07-09 NOTE — Progress Notes (Signed)
New Patient Note  RE: Sara Mercer MRN: [id number] DOB: 1965-07-13 Date of Office Visit: 07/09/2018  Referring provider: No ref. provider found Primary care provider: Jearld Fenton, NP  Chief Complaint: allergic reaction   History of present illness: Sara Mercer is a 53 y.o. female presenting today for evaluation of allergic reaction.    She had an allergic reaction on 07/03/18.    She states in the end of Jan she started having episodes of chest tightness and pressure for about 4 days or so.  She went to the ED and had cardiac work-up which was normal.  She states the chest tightness hasn't completely gone away.  She states she will be seeing GI to work on reflux control.   07/03/18 she reports having dinner at [employer] that she hadn't eaten at in about 10 years or so and had shrimp and chicken with rice and veggies (onions, zucchini).  She has had hibachi before at other restaurants without issue.  After dinner she went bowling approximately 1-2 hours after dinner and states she started to play and felt a weird sensation in her mouth.  She states her tongue felt tingly and she could feel like tongue was a bit swollen.  She states she was feeling itching all over.  She went to [employer].  She then noticed she developed hives and provided pictures of the hives on her face.      She states she eats shrimp without issue and believes she last had shrimp about 2 months prior.  She eats chicken multiple times a week and does eat rice, onions and zucchini relatively regular.  She states the hibachi dish also had a white sauce over it that she only eats when she has hibachi.    She feels for breakfast she may have bacon, eggs and grits.  She does not recall having lunch.    In the ED on 07/03/18 she was treated with solumedrol, benadryl, pepcid and IM epi.  She was discharged with 4 day of prednisone and pepcid.  She was also prescribed an epipen.     She does report nasal  congestion, PND, sneezing primarily during spring.  She will take flonase, allegra or zyrtec as needed.    Review of systems: Review of Systems  Constitutional: Negative for chills, fever and malaise/fatigue.  HENT: Negative for congestion, ear discharge, nosebleeds, sinus pain and sore throat.   Eyes: Negative for pain, discharge and redness.  Respiratory: Negative for cough, shortness of breath, wheezing and stridor.   Cardiovascular: Negative for chest pain.  Gastrointestinal: Negative for abdominal pain, constipation, diarrhea, heartburn, nausea and vomiting.  Musculoskeletal: Negative for joint pain.  Skin: Positive for itching and rash.  Neurological: Negative for headaches.    All other systems negative unless noted above in HPI  Past medical history: Past Medical History:  Diagnosis Date  . Allergy   . Anemia   . Chicken pox   . 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 GERD (gastroesophageal reflux disease)   . Positive TB test   . Thalassemia minor     Past surgical history: Past Surgical History:  Procedure Laterality Date  . BREAST BIOPSY Right 08/2011   negative results  . CHOLECYSTECTOMY  1992  . WISDOM TOOTH EXTRACTION      Family history:  Family History  Problem Relation Age of Onset  . Hypertension Maternal Grandmother   . Heart disease Maternal Grandmother  heart failure  . Glaucoma Maternal Grandfather   . Diabetes Maternal Grandfather   . Hypertension Maternal Grandfather   . Hypertension Father   . Stroke Father   . Diabetes Mother   . Glaucoma Mother   . Allergic rhinitis Mother   . Allergic rhinitis Brother   . Cancer Neg Hx   . Asthma Neg Hx   . Eczema Neg Hx   . Urticaria Neg Hx     Social history: Lives in a home with carpeting with electric heating and central cooling.  No pets in the home.  No concern for water damage, mildew or roaches in the home.  She is a NP for transitional care.   Medication  List: Allergies as of 07/09/2018   No Known Allergies     Medication List       Accurate as of July 09, 2018  4:47 PM. Always use your most recent med list.        b complex vitamins tablet Take 1 tablet by mouth daily.   EPINEPHrine 0.3 mg/0.3 mL Soaj injection Commonly known as:  EPIPEN 2-PAK Inject 0.3 mLs (0.3 mg total) into the muscle as needed for anaphylaxis.   fexofenadine 180 MG tablet Commonly known as:  ALLEGRA Take 1 tablet (180 mg total) by mouth daily.   fluticasone 50 MCG/ACT nasal spray Commonly known as:  FLONASE Place 2 sprays into both nostrils daily.   ibuprofen 200 MG tablet Commonly known as:  ADVIL,MOTRIN Take 800 mg by mouth as needed.   multivitamin tablet Take 1 tablet by mouth daily.   nitroGLYCERIN 0.4 MG SL tablet Commonly known as:  NITROSTAT Place 1 tablet (0.4 mg total) under the tongue every 5 (five) minutes as needed for chest pain.   omeprazole 20 MG capsule Commonly known as:  PRILOSEC Take 20 mg by mouth as needed.   Vitamin D 50 MCG (2000 UT) tablet Take 2,000 Units by mouth daily.       Known medication allergies: No Known Allergies   Physical examination: Blood pressure 132/90, pulse 98, temperature 97.8 F (36.6 C), temperature source Oral, resp. rate 20, height 5' 4.8" (1.646 m), weight 198 lb 6.4 oz (90 kg), SpO2 98 %.  General: Alert, interactive, in no acute distress. HEENT: PERRLA, TMs pearly gray, turbinates minimally edematous without discharge, post-pharynx non erythematous. Neck: Supple without lymphadenopathy. Lungs: Clear to auscultation without wheezing, rhonchi or rales. {no increased work of breathing. CV: Normal S1, S2 without murmurs. Abdomen: Nondistended, nontender. Skin: Warm and dry, without lesions or rashes. Extremities:  No clubbing, cyanosis or edema. Neuro:   Grossly intact.  Diagnositics/Labs: None today given recent reaction  Assessment and plan:   Allergic reaction    -  allergic reaction after eating [employer] shrimp and chicken.  Foods that you eat less commonly will obtain serum IgE levels (shellfish panel, alpha gal panel, cayenne).  You eat chicken, rice, onions, zucchini relatively regularly thus these are less likely to be culprits.     - will also obtain tryptase level to screen for mast cell (allergy cell) activation.  This will be your baseline level.  Will provide with tryptase lab to keep on hand if you have another reaction to have the lab drawn within 4-6 hours onset of reaction.   Tryptase should be elevated acutely during IgE mediated reactions.     - your symptoms have resolved thus ok to stop antihistamines.      - have access to self-injectable epinephrine (  Epipen or AuviQ) 0.3mg  at all times   - follow emergency action plan in case of allergic reaction     Allergic rhinitis   - continue flonase 2 sprays each nostril daily as needed for congestion. Use for 1-2 weeks at a time before stopping once symptoms improve.    - allegra 180mg  daily as needed for general allergy symptoms  - let us know if you would like to have environmental allergy testing in the future  Follow-up 3-4 months or sooner if needed  I appreciate the opportunity to take part in [facility]'s care. Please do not hesitate to contact me with questions.  Sincerely,   Prudy Feeler, MD Allergy/Immunology [facility] [place]

## 2018-07-14 ENCOUNTER — Ambulatory Visit: Payer: PRIVATE HEALTH INSURANCE | Admitting: Internal Medicine

## 2018-07-15 ENCOUNTER — Telehealth: Payer: Self-pay | Admitting: Allergy

## 2018-07-15 LAB — ALLERGEN PROFILE, SHELLFISH
Clam IgE: <0.1 kU/L
F023-IgE Crab: 0.81 kU/L — AB
F080-IGE LOBSTER: 1.03 kU/L — AB
F290-IgE Oyster: <0.1 kU/L
Scallop IgE: <0.1 kU/L
Shrimp IgE: 0.84 kU/L — AB

## 2018-07-15 LAB — ALPHA-GAL PANEL
Alpha Gal IgE*: <0.1 kU/L (ref <0.10)
Beef (Bos spp) IgE: <0.1 kU/L (ref <0.35)
Class Interpretation: 0
LAMB CLASS INTERPRETATION: 0
Lamb/Mutton (Ovis spp) IgE: <0.1 kU/L (ref <0.35)
PORK CLASS INTERPRETATION: 0
Pork (Sus spp) IgE: <0.1 kU/L (ref <0.35)

## 2018-07-15 LAB — TRYPTASE: Tryptase: 2.9 ug/L (ref 2.2–13.2)

## 2018-07-15 LAB — ALLERGEN,CHILI PEPPER,RF279: F279-IgE Chili Pepper: <0.1 kU/L

## 2018-07-15 NOTE — Telephone Encounter (Signed)
Discussed results with patient and she voiced understanding

## 2018-07-15 NOTE — Telephone Encounter (Signed)
Patient is returning a call about results.  

## 2018-07-15 NOTE — Telephone Encounter (Signed)
Left message for patient to call office.  

## 2018-07-27 ENCOUNTER — Other Ambulatory Visit: Payer: Self-pay | Admitting: Internal Medicine

## 2018-07-27 DIAGNOSIS — Z1231 Encounter for screening mammogram for malignant neoplasm of breast: Secondary | ICD-10-CM

## 2018-08-04 ENCOUNTER — Other Ambulatory Visit: Payer: Self-pay

## 2018-08-04 ENCOUNTER — Ambulatory Visit
Admission: RE | Admit: 2018-08-04 | Discharge: 2018-08-04 | Disposition: A | Discharge status: Home / Self Care | Payer: PRIVATE HEALTH INSURANCE | Source: Ambulatory Visit

## 2018-08-04 DIAGNOSIS — Z1231 Encounter for screening mammogram for malignant neoplasm of breast: Secondary | ICD-10-CM

## 2018-08-12 ENCOUNTER — Telehealth: Payer: Self-pay | Admitting: Gastroenterology

## 2018-08-12 NOTE — Telephone Encounter (Signed)
Left vm to r/s apt due to corona virus pt needs to r/s with Dr. Bonna Gains in 2-3 month

## 2018-08-18 ENCOUNTER — Encounter

## 2018-08-18 ENCOUNTER — Ambulatory Visit: Payer: PRIVATE HEALTH INSURANCE | Admitting: Gastroenterology

## 2018-09-08 ENCOUNTER — Telehealth: Payer: Self-pay | Admitting: Gastroenterology

## 2018-09-08 NOTE — Telephone Encounter (Signed)
I called patient  & l/m to r/s her appointment from 08-18-2018 to 2-3 months (per DR T)

## 2018-10-13 ENCOUNTER — Ambulatory Visit: Payer: PRIVATE HEALTH INSURANCE | Admitting: Allergy

## 2018-10-13 DIAGNOSIS — G5621 Lesion of ulnar nerve, right upper limb: Secondary | ICD-10-CM | POA: Insufficient documentation

## 2018-10-13 DIAGNOSIS — M25531 Pain in right wrist: Secondary | ICD-10-CM | POA: Insufficient documentation

## 2018-10-13 DIAGNOSIS — M654 Radial styloid tenosynovitis [de Quervain]: Secondary | ICD-10-CM | POA: Insufficient documentation

## 2018-11-10 ENCOUNTER — Ambulatory Visit: Payer: PRIVATE HEALTH INSURANCE | Admitting: Gastroenterology

## 2019-01-05 ENCOUNTER — Other Ambulatory Visit: Payer: Self-pay

## 2019-01-05 ENCOUNTER — Ambulatory Visit: Payer: BC Managed Care – PPO | Admitting: Gastroenterology

## 2019-01-05 ENCOUNTER — Encounter: Payer: Self-pay | Admitting: Gastroenterology

## 2019-01-05 VITALS — BP 120/80 | HR 72 | Temp 98.3°F | Ht 64.5 in | Wt 201.0 lb

## 2019-01-05 DIAGNOSIS — D509 Iron deficiency anemia, unspecified: Secondary | ICD-10-CM

## 2019-01-05 DIAGNOSIS — Z1211 Encounter for screening for malignant neoplasm of colon: Secondary | ICD-10-CM

## 2019-01-05 NOTE — Progress Notes (Signed)
Sara Mercer [address]  [place]  [place], [place] [address]  Main: [phone]  Fax: [phone]   Gastroenterology Consultation  Referring Provider:     Jearld Fenton, NP Primary Care Physician:  Jearld Fenton, NP Reason for Consultation:    Nonspecific chest pain        HPI:    Chief Complaint  Patient presents with  . New Patient (Initial Visit)    Patient states she is overdue for colonoscopy. Patient has never had one. Is having no symptoms     [name] is a 53 y.o. y/o female referred for consultation & management  by Dr. Garnette Gunner, Coralie Keens, NP.  Patient was initially referred to [place] in February 2020 for chest pain.  Patient had rescheduled her appointment at the time due to [place].  Now patient states her symptoms have completely resolved and attributes them to stress.  She has undergone cardiology work-up with her cardiologist and states this work-up was negative. The patient denies abdominal or flank pain, anorexia, nausea or vomiting, heartburn, dysphagia, change in bowel habits or black or bloody stools or weight loss.  No family history of colon cancer.  No prior colonoscopy. patient is just interested in a screening colonoscopy at this time.  I also noted the patient has microcytosis and discussed this with her.  She reports previous work-up 30 years ago for this and that she was told she has thalassemia.  Has never seen a hematologist for this.   Past Medical History:  Diagnosis Date  . Allergy   . Anemia   . Chicken pox   . 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 GERD (gastroesophageal reflux disease)   . Positive TB test   . Thalassemia minor     Past Surgical History:  Procedure Laterality Date  . BREAST BIOPSY Right 08/2011   negative results  . CHOLECYSTECTOMY  1992  . WISDOM TOOTH EXTRACTION      Prior to Admission medications   Medication Sig Start Date End Date Taking? Authorizing Provider  b complex  vitamins tablet Take 1 tablet by mouth daily.   Yes [provider]  Cholecalciferol (VITAMIN D) 2000 units tablet Take 2,000 Units by mouth daily.   Yes [provider]  EPINEPHrine (EPIPEN 2-PAK) 0.3 mg/0.3 mL IJ SOAJ injection Inject 0.3 mLs (0.3 mg total) into the muscle as needed for anaphylaxis. 07/03/18  Yes Deno Etienne, DO  fexofenadine (ALLEGRA) 180 MG tablet Take 1 tablet (180 mg total) by mouth daily. Patient taking differently: Take 180 mg by mouth as needed.  07/30/15  Yes Baity, Coralie Keens, NP  fluticasone (FLONASE) 50 MCG/ACT nasal spray Place 2 sprays into both nostrils daily. 07/30/15  Yes Baity, Coralie Keens, NP  ibuprofen (ADVIL,MOTRIN) 200 MG tablet Take 800 mg by mouth as needed.   Yes [provider]  Multiple Vitamin (MULTIVITAMIN) tablet Take 1 tablet by mouth daily.   Yes [provider]  omeprazole (PRILOSEC) 20 MG capsule Take 20 mg by mouth as needed.  09/28/17  Yes [provider]    Family History  Problem Relation Age of Onset  . Hypertension Maternal Grandmother   . Heart disease Maternal Grandmother        heart failure  . Glaucoma Maternal Grandfather   . Diabetes Maternal Grandfather   . Hypertension Maternal Grandfather   . Hypertension Father   . Stroke Father   . Diabetes Mother   .  Glaucoma Mother   . Allergic rhinitis Mother   . Allergic rhinitis Brother   . Cancer Neg Hx   . Asthma Neg Hx   . Eczema Neg Hx   . Urticaria Neg Hx      Social History   Tobacco Use  . Smoking status: Never Smoker  . Smokeless tobacco: Never Used  Substance Use Topics  . Alcohol use: Yes    Alcohol/week: 0.0 standard drinks    Comment: occasional  . Drug use: No    Allergies as of 01/05/2019 - Review Complete 01/05/2019  Allergen Reaction Noted  . Shellfish allergy Anaphylaxis, Hives, Itching, and Swelling 07/04/2018    Review of Systems:    All systems reviewed and negative except where noted in HPI.   Physical Exam:   There were no vitals taken for this visit. No LMP recorded. Patient is perimenopausal. Psych:  Alert and cooperative. Normal mood and affect. General:   Alert,  Well-developed, well-nourished, pleasant and cooperative in NAD Head:  Normocephalic and atraumatic. Eyes:  Sclera clear, no icterus.   Conjunctiva pink. Ears:  Normal auditory acuity. Nose:  No deformity, discharge, or lesions. Mouth:  No deformity or lesions,oropharynx pink & moist. Neck:  Supple; no masses or thyromegaly. Abdomen:  Normal bowel sounds.  No bruits.  Soft, non-tender and non-distended without masses, hepatosplenomegaly or hernias noted.  No guarding or rebound tenderness.    Msk:  Symmetrical without gross deformities. Good, equal movement & strength bilaterally. Pulses:  Normal pulses noted. Extremities:  No clubbing or edema.  No cyanosis. Neurologic:  Alert and oriented x3;  grossly normal neurologically. Skin:  Intact without significant lesions or rashes. No jaundice. Lymph Nodes:  No significant cervical adenopathy. Psych:  Alert and cooperative. Normal mood and affect.   Labs: CBC    Component Value Date/Time   WBC 7.2 06/27/2018 0950   RBC 5.00 06/27/2018 0950   HGB 11.2 (L) 06/27/2018 0950   HCT 36.7 06/27/2018 0950   PLT 277 06/27/2018 0950   MCV 73.4 (L) 06/27/2018 0950   MCH 22.4 (L) 06/27/2018 0950   MCHC 30.5 06/27/2018 0950   RDW 15.8 (H) 06/27/2018 0950   LYMPHSABS 2.6 06/20/2016 1412   MONOABS 1.0 06/20/2016 1412   EOSABS 0.0 06/20/2016 1412   BASOSABS 0.0 06/20/2016 1412   CMP     Component Value Date/Time   NA 142 06/27/2018 0950   K 4.2 06/27/2018 0950   CL 108 06/27/2018 0950   CO2 28 06/27/2018 0950   GLUCOSE 86 06/27/2018 0950   BUN 12 06/27/2018 0950   CREATININE 0.77 06/27/2018 0950   CALCIUM 9.3 06/27/2018 0950   PROT 7.4 06/29/2018 1616   ALBUMIN 4.4 06/29/2018 1616   AST 23 06/29/2018 1616   ALT 25 06/29/2018 1616   ALKPHOS 45 06/29/2018 1616   BILITOT 0.3  06/29/2018 1616   GFRNONAA >60 06/27/2018 0950   GFRAA >60 06/27/2018 0950    Imaging Studies: No results found.  Assessment and Plan:   [name] is a 53 y.o. y/o female has been referred for nonspecific chest pain  The chest pain is completely resolved Patient reports cardiac work-up for this was completed This occurred in February 2020 and patient attributes that to stressors at the time  Patient is asymptomatic and not interested in upper endoscopy at that time given that symptoms have completely resolved and no alarm symptoms are present  No symptoms of GERD are present either  Screening colonoscopy indicated  I have discussed hematology referral due to microcytosis and she is agreeable.  Referral placed.  We will also check ferritin level.  If this is low, she will need EGD along with her colonoscopy.  I have discussed alternative options, risks & benefits,  which include, but are not limited to, bleeding, infection, perforation,respiratory complication & drug reaction.  The patient agrees with this plan & written consent will be obtained.     Dr Sara Mercer  Speech recognition software was used to dictate the above note.

## 2019-01-06 ENCOUNTER — Other Ambulatory Visit: Payer: Self-pay | Admitting: Gastroenterology

## 2019-01-06 DIAGNOSIS — D509 Iron deficiency anemia, unspecified: Secondary | ICD-10-CM

## 2019-01-06 LAB — FERRITIN: Ferritin: 163 ng/mL — ABNORMAL HIGH (ref 15–150)

## 2019-01-06 NOTE — Addendum Note (Signed)
Addended by: Ulyess Blossom L on: 01/06/2019 04:02 PM   Modules accepted: Orders

## 2019-01-06 NOTE — Addendum Note (Signed)
Addended by: Vonda Antigua on: 01/06/2019 02:29 PM   Modules accepted: Orders

## 2019-01-07 ENCOUNTER — Telehealth: Payer: Self-pay | Admitting: Gastroenterology

## 2019-01-07 NOTE — Telephone Encounter (Signed)
-----   Message from [place], [place] sent at 01/06/2019  4:58 PM EDT -----  ----- Message ----- From: Virgel Manifold, MD Sent: 01/06/2019   2:31 PM EDT To: Martie Lee, LPN  Your ferritin level was mildly elevated. This can be elevated due to a reaction from other causes instead of a true elevation of iron stores. I have ordered a follow up lab test to be done at [facility]. Please call our office to get this lab done at our [facility] should follow up hematology as well and a referral has been placed

## 2019-01-07 NOTE — Telephone Encounter (Signed)
Patient verbalized understanding of lab results and she will go get labs at a [facility] office. We [facility]ed the orders yesterday to the [facility]

## 2019-01-21 ENCOUNTER — Inpatient Hospital Stay: Payer: BC Managed Care – PPO | Attending: Oncology | Admitting: Oncology

## 2019-01-21 ENCOUNTER — Inpatient Hospital Stay: Payer: BC Managed Care – PPO

## 2019-01-21 ENCOUNTER — Encounter: Payer: BC Managed Care – PPO | Admitting: Oncology

## 2019-01-21 ENCOUNTER — Encounter: Payer: Self-pay | Admitting: Oncology

## 2019-01-21 ENCOUNTER — Other Ambulatory Visit: Payer: Self-pay

## 2019-01-21 VITALS — BP 138/85 | HR 63 | Temp 97.8°F | Ht 64.5 in | Wt 200.0 lb

## 2019-01-21 DIAGNOSIS — D509 Iron deficiency anemia, unspecified: Secondary | ICD-10-CM | POA: Diagnosis not present

## 2019-01-21 DIAGNOSIS — R5383 Other fatigue: Secondary | ICD-10-CM

## 2019-01-21 DIAGNOSIS — D563 Thalassemia minor: Secondary | ICD-10-CM | POA: Diagnosis not present

## 2019-01-21 DIAGNOSIS — Z79899 Other long term (current) drug therapy: Secondary | ICD-10-CM | POA: Diagnosis not present

## 2019-01-21 LAB — CBC WITH DIFFERENTIAL/PLATELET
Abs Immature Granulocytes: 0.05 10*3/uL (ref 0.00–0.07)
Basophils Absolute: 0 10*3/uL (ref 0.0–0.1)
Basophils Relative: 0 %
Eosinophils Absolute: 0 10*3/uL (ref 0.0–0.5)
Eosinophils Relative: 0 %
HCT: 35.4 % — ABNORMAL LOW (ref 36.0–46.0)
Hemoglobin: 11 g/dL — ABNORMAL LOW (ref 12.0–15.0)
Immature Granulocytes: 1 %
Lymphocytes Relative: 31 %
Lymphs Abs: 2.5 10*3/uL (ref 0.7–4.0)
MCH: 22.4 pg — ABNORMAL LOW (ref 26.0–34.0)
MCHC: 31.1 g/dL (ref 30.0–36.0)
MCV: 72 fL — ABNORMAL LOW (ref 80.0–100.0)
Monocytes Absolute: 0.7 10*3/uL (ref 0.1–1.0)
Monocytes Relative: 9 %
Neutro Abs: 4.9 10*3/uL (ref 1.7–7.7)
Neutrophils Relative %: 59 %
Platelets: 278 10*3/uL (ref 150–400)
RBC: 4.92 MIL/uL (ref 3.87–5.11)
RDW: 15.1 % (ref 11.5–15.5)
WBC: 8.2 10*3/uL (ref 4.0–10.5)
nRBC: 0 % (ref 0.0–0.2)

## 2019-01-21 LAB — RETIC PANEL
Immature Retic Fract: 12.3 % (ref 2.3–15.9)
RBC.: 4.77 MIL/uL (ref 3.87–5.11)
Retic Count, Absolute: 73.5 10*3/uL (ref 19.0–186.0)
Retic Ct Pct: 1.5 % (ref 0.4–3.1)
Reticulocyte Hemoglobin: 26 pg — ABNORMAL LOW (ref >27.9)

## 2019-01-21 LAB — TECHNOLOGIST SMEAR REVIEW: Plt Morphology: ADEQUATE

## 2019-01-21 LAB — IRON AND TIBC
Iron: 61 ug/dL (ref 28–170)
Saturation Ratios: 18 % (ref 10.4–31.8)
TIBC: 341 ug/dL (ref 250–450)
UIBC: 280 ug/dL

## 2019-01-21 LAB — FERRITIN: Ferritin: 97 ng/mL (ref 11–307)

## 2019-01-21 NOTE — Progress Notes (Signed)
Patient here today for new consult for anemia.   

## 2019-01-21 NOTE — Progress Notes (Signed)
Hematology/Oncology Consult note [facility] Telephone:([phone] [phone]   Patient Care Team: Jearld Fenton, NP as PCP - General (Internal Medicine)  REFERRING PROVIDER: Virgel Manifold, MD  CHIEF COMPLAINTS/REASON FOR VISIT:  Evaluation of anemia  HISTORY OF PRESENTING ILLNESS:  Sara Mercer is a  53 y.o.  female with PMH listed below who was referred to me for evaluation of anemia Reviewed patient's recent labs that was done.  Labs revealed anemia with hemoglobin of 11.2, MCV 73.4., Reviewed patient's previous labs ordered by primary care physician's office, anemia is chronic onset , duration is since at least 2017. Patient also reports being told that Sara Mercer has iron deficiency in the past.  Sara Mercer takes iron occasionally. 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 No aggravating or improving factors.  Associated signs and symptoms: Patient reports fatigue.  Denies SOB with exertion.  Denies weight loss, easy bruising, hematochezia, hemoptysis, hematuria. Context: History of GI bleeding: Denies               History of Chronic kidney disease denies               History of autoimmune disease denies               History of hemolytic anemia.  Denies               Last colonoscopy: Never had one.  Sara Mercer has establish care with gastroenterology and will have colonoscopy done in the near future.     Review of Systems  Constitutional: Positive for fatigue. Negative for appetite change, chills and fever.  HENT:   Negative for hearing loss and voice change.   Eyes: Negative for eye problems.  Respiratory: Negative for chest tightness and cough.   Cardiovascular: Negative for chest pain.  Gastrointestinal: Negative for abdominal distention, abdominal pain and blood in stool.  Endocrine: Negative for hot flashes.  Genitourinary: Negative for difficulty urinating and frequency.   Musculoskeletal: Negative for  arthralgias.  Skin: Negative for itching and rash.  Neurological: Negative for extremity weakness.  Hematological: Negative for adenopathy.  Psychiatric/Behavioral: Negative for confusion.     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   GERD (gastroesophageal reflux disease)    Positive TB test    Thalassemia minor     SURGICAL HISTORY: Past Surgical History:  Procedure Laterality Date   BREAST BIOPSY Right 08/2011   negative results   CHOLECYSTECTOMY  1992   WISDOM TOOTH EXTRACTION      SOCIAL HISTORY: Social History   Socioeconomic History   Marital status: Divorced    Spouse name: Not on file   Number of children: Not on file   Years of education: Not on file   Highest education level: Not on file  Occupational History   Not on file  Social Needs   Financial resource strain: Not on file   Food insecurity    Worry: Not on file    Inability: Not on file   Transportation needs    Medical: Not on file    Non-medical: Not on file  Tobacco Use   Smoking status: Never Smoker   Smokeless tobacco: Never Used  Substance and Sexual Activity   Alcohol use: Yes    Alcohol/week: 0.0 standard  drinks    Comment: occasional   Drug use: No   Sexual activity: Not Currently    Birth control/protection: None  Lifestyle   Physical activity    Days per week: Not on file    Minutes per session: Not on file   Stress: Not on file  Relationships   Social connections    Talks on phone: Not on file    Gets together: Not on file    Attends religious service: Not on file    Active member of club or organization: Not on file    Attends meetings of clubs or organizations: Not on file    Relationship status: Not on file   Intimate partner violence    Fear of current or ex partner: Not on file    Emotionally abused: Not on file    Physically  abused: Not on file    Forced sexual activity: Not on file  Other Topics Concern   Not on file  Social History Narrative   Not on file    FAMILY HISTORY: Family History  Problem Relation Age of Onset   Hypertension Maternal Grandmother    Heart disease Maternal Grandmother        heart failure   Glaucoma Maternal Grandfather    Diabetes Maternal Grandfather    Hypertension Maternal Grandfather    Hypertension Father    Stroke Father    Diabetes Mother    Glaucoma Mother    Allergic rhinitis Mother    Allergic rhinitis Brother    Colon cancer Brother    Cancer Neg Hx    Asthma Neg Hx    Eczema Neg Hx    Urticaria Neg Hx     ALLERGIES:  is allergic to shellfish allergy.  MEDICATIONS:  Current Outpatient Medications  Medication Sig Dispense Refill   b complex vitamins tablet Take 1 tablet by mouth daily.     Cholecalciferol (VITAMIN D) 2000 units tablet Take 2,000 Units by mouth daily.     EPINEPHrine (EPIPEN 2-PAK) 0.3 mg/0.3 mL IJ SOAJ injection Inject 0.3 mLs (0.3 mg total) into the muscle as needed for anaphylaxis. 1 Device 0   fexofenadine (ALLEGRA) 180 MG tablet Take 1 tablet (180 mg total) by mouth daily. (Patient taking differently: Take 180 mg by mouth as needed. ) 30 tablet 5   fluticasone (FLONASE) 50 MCG/ACT nasal spray Place 2 sprays into both nostrils daily. 16 g 5   ibuprofen (ADVIL,MOTRIN) 200 MG tablet Take 800 mg by mouth as needed.     Multiple Vitamin (MULTIVITAMIN) tablet Take 1 tablet by mouth daily.     omeprazole (PRILOSEC) 20 MG capsule Take 20 mg by mouth as needed.      No current facility-administered medications for this visit.      PHYSICAL EXAMINATION: ECOG PERFORMANCE STATUS: 0 - Asymptomatic Vitals:   01/21/19 1502  BP: 138/85  Pulse: 63  Temp: 97.8 F (36.6 C)   Filed Weights   01/21/19 1502  Weight: 200 lb (90.7 kg)    Physical Exam Constitutional:      General: Sara Mercer is not in acute  distress. HENT:     Head: Normocephalic and atraumatic.  Eyes:     General: No scleral icterus.    Pupils: Pupils are equal, round, and reactive to light.  Neck:     Musculoskeletal: Normal range of motion and neck supple.  Cardiovascular:     Rate and Rhythm: Normal rate and regular rhythm.  Heart sounds: Normal heart sounds.  Pulmonary:     Effort: Pulmonary effort is normal. No respiratory distress.     Breath sounds: No wheezing.  Abdominal:     General: Bowel sounds are normal. There is no distension.     Palpations: Abdomen is soft. There is no mass.     Tenderness: There is no abdominal tenderness.  Musculoskeletal: Normal range of motion.        General: No deformity.  Skin:    General: Skin is warm and dry.     Findings: No erythema or rash.  Neurological:     Mental Status: Sara Mercer is alert and oriented to person, place, and time.     Cranial Nerves: No cranial nerve deficit.     Coordination: Coordination normal.  Psychiatric:        Behavior: Behavior normal.        Thought Content: Thought content normal.      LABORATORY DATA:  I have reviewed the data as listed Lab Results  Component Value Date   WBC 8.2 01/21/2019   HGB 11.0 (L) 01/21/2019   HCT 35.4 (L) 01/21/2019   MCV 72.0 (L) 01/21/2019   PLT 278 01/21/2019   Recent Labs    06/27/18 0950 06/29/18 1616  NA 142  --   K 4.2  --   CL 108  --   CO2 28  --   GLUCOSE 86  --   BUN 12  --   CREATININE 0.77  --   CALCIUM 9.3  --   GFRNONAA >60  --   GFRAA >60  --   PROT  --  7.4  ALBUMIN  --  4.4  AST  --  23  ALT  --  25  ALKPHOS  --  45  BILITOT  --  0.3  BILIDIR  --  0.0   Iron/TIBC/Ferritin/ %Sat    Component Value Date/Time   IRON 61 01/21/2019 1530   TIBC 341 01/21/2019 1530   FERRITIN 97 01/21/2019 1530   FERRITIN 163 (H) 01/05/2019 1424   IRONPCTSAT 18 01/21/2019 1530    RADIOGRAPHIC STUDIES: I have personally reviewed the radiological images as listed and agreed with the  findings in the report. No results found.    ASSESSMENT & PLAN:  1. Microcytic anemia    Labs are reviewed and discussed with patient. Patient has a chronic history of microcytic anemia. Ferritin was checked which was elevated is 163, not consistent with iron deficiency .  I recommend to repeat CBC, check a differential, check smear, repeat iron, TIBC, Ferritin  Suspect that Sara Mercer may has hemoglobinopathy. Recommend hemoglobinopathy for further evaluation. Hold any oral iron supplementation at this point.   CBC iron panel was reviewed.  Patient has hemoglobin of 11.  Iron panel was not consistent with iron deficiency.    Orders Placed This Encounter  Procedures   CBC with Differential/Platelet    Standing Status:   Future    Number of Occurrences:   1    Standing Expiration Date:   01/21/2020   Retic Panel    Standing Status:   Future    Number of Occurrences:   1    Standing Expiration Date:   01/21/2020   Iron and TIBC    Standing Status:   Future    Number of Occurrences:   1    Standing Expiration Date:   01/21/2020   Ferritin    Standing Status:   Future  Number of Occurrences:   1    Standing Expiration Date:   01/21/2020   Technologist smear review    Standing Status:   Future    Number of Occurrences:   1    Standing Expiration Date:   01/21/2020   Hemoglobinopathy evaluation    Standing Status:   Future    Number of Occurrences:   1    Standing Expiration Date:   01/21/2020    All questions were answered. The patient knows to call the clinic with any problems questions or concerns. Cc. Virgel Manifold, MD  Return of visit: Follow-up as needed. Thank you for this kind referral and the opportunity to participate in the care of this patient. A copy of today's note is routed to referring provider  Total face to face encounter time for this patient visit was 25min. >50% of the time was  spent in counseling and coordination of care.    Earlie Server, MD,  PhD 01/21/2019

## 2019-01-24 LAB — HEMOGLOBINOPATHY EVALUATION
Hgb A2 Quant: 1.8 % (ref 1.8–3.2)
Hgb A: 97.6 % (ref 96.4–98.8)
Hgb C: 0 %
Hgb F Quant: 0.6 % (ref 0.0–2.0)
Hgb S Quant: 0 %
Hgb Variant: 0 %

## 2019-01-28 ENCOUNTER — Other Ambulatory Visit: Payer: Self-pay | Admitting: Internal Medicine

## 2019-01-28 MED ORDER — METFORMIN HCL ER 500 MG PO TB24: 500.0000 mg | ORAL_TABLET | Freq: Every day | ORAL | 0 refills | Status: DC

## 2019-01-28 NOTE — Addendum Note (Signed)
Addended by: Lurlean Nanny on: 01/28/2019 05:12 PM   Modules accepted: Orders

## 2019-01-28 NOTE — Addendum Note (Signed)
Addended by: Lurlean Nanny on: 01/28/2019 10:34 AM   Modules accepted: Orders

## 2019-02-02 ENCOUNTER — Other Ambulatory Visit: Payer: Self-pay | Admitting: Podiatry

## 2019-02-02 ENCOUNTER — Ambulatory Visit (INDEPENDENT_AMBULATORY_CARE_PROVIDER_SITE_OTHER): Payer: BC Managed Care – PPO

## 2019-02-02 ENCOUNTER — Ambulatory Visit (INDEPENDENT_AMBULATORY_CARE_PROVIDER_SITE_OTHER): Payer: BC Managed Care – PPO | Admitting: Podiatry

## 2019-02-02 ENCOUNTER — Other Ambulatory Visit: Payer: Self-pay

## 2019-02-02 ENCOUNTER — Encounter: Payer: Self-pay | Admitting: Podiatry

## 2019-02-02 VITALS — BP 146/74

## 2019-02-02 DIAGNOSIS — S92515A Nondisplaced fracture of proximal phalanx of left lesser toe(s), initial encounter for closed fracture: Secondary | ICD-10-CM

## 2019-02-02 DIAGNOSIS — S92514A Nondisplaced fracture of proximal phalanx of right lesser toe(s), initial encounter for closed fracture: Secondary | ICD-10-CM | POA: Diagnosis not present

## 2019-02-02 DIAGNOSIS — M79671 Pain in right foot: Secondary | ICD-10-CM

## 2019-02-04 DIAGNOSIS — H524 Presbyopia: Secondary | ICD-10-CM | POA: Diagnosis not present

## 2019-02-04 DIAGNOSIS — E119 Type 2 diabetes mellitus without complications: Secondary | ICD-10-CM | POA: Diagnosis not present

## 2019-02-04 DIAGNOSIS — H5213 Myopia, bilateral: Secondary | ICD-10-CM | POA: Diagnosis not present

## 2019-02-05 NOTE — Progress Notes (Signed)
   HPI: 53 y.o. female presenting today with a chief complaint of pain to the left third toe that began about two weeks ago secondary to an injury. She reports associated bruising and swelling of the foot. She states the pain began when she accidentally hit the foot on a chair. Bearing weight increases the pain. She has been taping the foot, icing it and taking Ibuprofen. Patient is here for further evaluation and treatment.   Past Medical History:  Diagnosis Date  . Allergy   . Anemia   . Chicken pox   . 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 GERD (gastroesophageal reflux disease)   . Positive TB test   . Thalassemia minor      Physical Exam: General: The patient is alert and oriented x3 in no acute distress.  Dermatology: Skin is warm, dry and supple bilateral lower extremities. Negative for open lesions or macerations.  Vascular: Palpable pedal pulses bilaterally. No edema or erythema noted. Capillary refill within normal limits.  Neurological: Epicritic and protective threshold grossly intact bilaterally.   Musculoskeletal Exam: Pain with palpation noted to the proximal third phalanx of the left foot. Range of motion within normal limits to all pedal and ankle joints bilateral. Muscle strength 5/5 in all groups bilateral.   Radiographic Exam:  Closed, nondisplaced fracture of the proximal third phalanx of the left foot.     Assessment: 1. Closed, nondisplaced fracture of the proximal third phalanx of the left foot.   Plan of Care:  1. Patient evaluated. X-Rays reviewed.  2. Post op shoe dispensed. Weightbearing as tolerated.  3. Return to clinic in 4 weeks for follow up X-Ray.       Edrick Kins, DPM [facility]  Dr. Edrick Kins, DPM    [address], [place] [address]                Office [phone]  Fax [phone]

## 2019-02-14 ENCOUNTER — Other Ambulatory Visit: Payer: Self-pay | Admitting: Oncology

## 2019-02-14 DIAGNOSIS — D509 Iron deficiency anemia, unspecified: Secondary | ICD-10-CM

## 2019-02-15 ENCOUNTER — Encounter: Payer: Self-pay | Admitting: Oncology

## 2019-02-15 ENCOUNTER — Telehealth: Payer: Self-pay

## 2019-02-15 NOTE — Telephone Encounter (Signed)
LM informing patient.

## 2019-02-15 NOTE — Telephone Encounter (Signed)
-----   Message from Earlie Server, MD sent at 02/14/2019 11:19 PM EDT ----- Please arrange patient to have additional lab encounter. Blood order in epic. Thanks.

## 2019-02-16 ENCOUNTER — Other Ambulatory Visit: Payer: Self-pay

## 2019-02-16 ENCOUNTER — Inpatient Hospital Stay: Payer: BC Managed Care – PPO | Attending: Oncology

## 2019-02-16 DIAGNOSIS — D509 Iron deficiency anemia, unspecified: Secondary | ICD-10-CM

## 2019-02-18 ENCOUNTER — Other Ambulatory Visit: Payer: BC Managed Care – PPO

## 2019-03-02 ENCOUNTER — Encounter: Payer: Self-pay | Admitting: Podiatry

## 2019-03-02 ENCOUNTER — Ambulatory Visit: Payer: BC Managed Care – PPO | Admitting: Podiatry

## 2019-03-02 ENCOUNTER — Ambulatory Visit (INDEPENDENT_AMBULATORY_CARE_PROVIDER_SITE_OTHER): Payer: BC Managed Care – PPO

## 2019-03-02 ENCOUNTER — Other Ambulatory Visit: Payer: Self-pay

## 2019-03-02 DIAGNOSIS — S92015D Nondisplaced fracture of body of left calcaneus, subsequent encounter for fracture with routine healing: Secondary | ICD-10-CM | POA: Diagnosis not present

## 2019-03-06 NOTE — Progress Notes (Signed)
   HPI: 53 y.o. female presenting today for follow up evaluation of a fractured proximal third phalanx of the left foot. She states she is improving and doing better. She reports some intermittent numbness and swelling. She has been using the post op shoe as directed. She denies any modifying factors. Patient is here for further evaluation and treatment.   Past Medical History:  Diagnosis Date  . Allergy   . Anemia   . Chicken pox   . 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 GERD (gastroesophageal reflux disease)   . Positive TB test   . Thalassemia minor      Physical Exam: General: The patient is alert and oriented x3 in no acute distress.  Dermatology: Skin is warm, dry and supple bilateral lower extremities. Negative for open lesions or macerations.  Vascular: Palpable pedal pulses bilaterally. No edema or erythema noted. Capillary refill within normal limits.  Neurological: Epicritic and protective threshold grossly intact bilaterally.   Musculoskeletal Exam: Pain with palpation noted to the proximal third phalanx of the left foot. Range of motion within normal limits to all pedal and ankle joints bilateral. Muscle strength 5/5 in all groups bilateral.   Radiographic Exam:  Closed, nondisplaced fracture of the proximal third phalanx of the left foot with routine healing.   Assessment: 1. Closed, nondisplaced fracture of the proximal third phalanx of the left foot.   Plan of Care:  1. Patient evaluated. X-Rays reviewed.  2. May resume full activity with no restrictions.  3. Discontinue using post op shoe.  4. Return to clinic as needed.      Edrick Kins, DPM [facility]  Dr. Edrick Kins, DPM    [address], [place] [address]                Office [phone]  Fax [phone]

## 2019-03-09 LAB — ALPHA-THALASSEMIA GENOTYPR

## 2019-04-26 MED ORDER — PEG 3350-KCL-NA BICARB-NACL 420 G PO SOLR: 4000.0000 mL | Freq: Once | ORAL | 0 refills | Status: AC

## 2019-05-02 ENCOUNTER — Other Ambulatory Visit
Admission: RE | Admit: 2019-05-02 | Discharge: 2019-05-02 | Disposition: A | Discharge status: Home / Self Care | Payer: BC Managed Care – PPO | Source: Ambulatory Visit | Attending: Gastroenterology | Admitting: Gastroenterology

## 2019-05-02 ENCOUNTER — Other Ambulatory Visit: Payer: Self-pay

## 2019-05-02 DIAGNOSIS — Z01812 Encounter for preprocedural laboratory examination: Secondary | ICD-10-CM | POA: Diagnosis not present

## 2019-05-02 DIAGNOSIS — Z20828 Contact with and (suspected) exposure to other viral communicable diseases: Secondary | ICD-10-CM | POA: Diagnosis not present

## 2019-05-02 LAB — SARS CORONAVIRUS 2 (TAT 6-24 HRS): SARS Coronavirus 2: NEGATIVE

## 2019-05-05 ENCOUNTER — Encounter
Admission: RE | Disposition: A | Discharge status: Home / Self Care | Payer: Self-pay | Source: Home / Self Care | Attending: Gastroenterology

## 2019-05-05 ENCOUNTER — Ambulatory Visit: Payer: BC Managed Care – PPO | Admitting: Certified Registered Nurse Anesthetist

## 2019-05-05 ENCOUNTER — Ambulatory Visit
Admission: RE | Admit: 2019-05-05 | Discharge: 2019-05-05 | Disposition: A | Discharge status: Home / Self Care | Payer: BC Managed Care – PPO | Attending: Gastroenterology | Admitting: Gastroenterology

## 2019-05-05 ENCOUNTER — Other Ambulatory Visit: Payer: Self-pay

## 2019-05-05 ENCOUNTER — Encounter: Payer: Self-pay | Admitting: Gastroenterology

## 2019-05-05 DIAGNOSIS — Z91013 Allergy to seafood: Secondary | ICD-10-CM | POA: Diagnosis not present

## 2019-05-05 DIAGNOSIS — D563 Thalassemia minor: Secondary | ICD-10-CM | POA: Insufficient documentation

## 2019-05-05 DIAGNOSIS — Z79899 Other long term (current) drug therapy: Secondary | ICD-10-CM | POA: Insufficient documentation

## 2019-05-05 DIAGNOSIS — K219 Gastro-esophageal reflux disease without esophagitis: Secondary | ICD-10-CM | POA: Diagnosis not present

## 2019-05-05 DIAGNOSIS — Z823 Family history of stroke: Secondary | ICD-10-CM | POA: Diagnosis not present

## 2019-05-05 DIAGNOSIS — Z791 Long term (current) use of non-steroidal anti-inflammatories (NSAID): Secondary | ICD-10-CM | POA: Insufficient documentation

## 2019-05-05 DIAGNOSIS — Z83511 Family history of glaucoma: Secondary | ICD-10-CM | POA: Diagnosis not present

## 2019-05-05 DIAGNOSIS — Z7984 Long term (current) use of oral hypoglycemic drugs: Secondary | ICD-10-CM | POA: Diagnosis not present

## 2019-05-05 DIAGNOSIS — Z8249 Family history of ischemic heart disease and other diseases of the circulatory system: Secondary | ICD-10-CM | POA: Diagnosis not present

## 2019-05-05 DIAGNOSIS — E119 Type 2 diabetes mellitus without complications: Secondary | ICD-10-CM | POA: Diagnosis not present

## 2019-05-05 DIAGNOSIS — Z833 Family history of diabetes mellitus: Secondary | ICD-10-CM | POA: Diagnosis not present

## 2019-05-05 DIAGNOSIS — Z800 Family history of malignant neoplasm of digestive organs: Secondary | ICD-10-CM | POA: Insufficient documentation

## 2019-05-05 DIAGNOSIS — D649 Anemia, unspecified: Secondary | ICD-10-CM | POA: Diagnosis not present

## 2019-05-05 DIAGNOSIS — Z9049 Acquired absence of other specified parts of digestive tract: Secondary | ICD-10-CM | POA: Diagnosis not present

## 2019-05-05 DIAGNOSIS — Z1211 Encounter for screening for malignant neoplasm of colon: Secondary | ICD-10-CM | POA: Diagnosis not present

## 2019-05-05 HISTORY — PX: COLONOSCOPY WITH PROPOFOL: SHX5780

## 2019-05-05 SURGERY — COLONOSCOPY WITH PROPOFOL
Anesthesia: General

## 2019-05-05 MED ORDER — PROPOFOL 500 MG/50ML IV EMUL
INTRAVENOUS | Status: DC | PRN
  Administered 2019-05-05: 130 ug/kg/min via INTRAVENOUS

## 2019-05-05 MED ORDER — PROPOFOL 10 MG/ML IV BOLUS
INTRAVENOUS | Status: DC | PRN
  Administered 2019-05-05: 80 mg via INTRAVENOUS

## 2019-05-05 MED ORDER — MIDAZOLAM HCL 2 MG/2ML IJ SOLN
INTRAMUSCULAR | Status: AC
  Filled 2019-05-05: qty 2

## 2019-05-05 MED ORDER — LIDOCAINE HCL (PF) 2 % IJ SOLN
INTRAMUSCULAR | Status: AC
  Filled 2019-05-05: qty 10

## 2019-05-05 MED ORDER — SODIUM CHLORIDE 0.9 % IV SOLN
INTRAVENOUS | Status: DC
  Administered 2019-05-05: 08:00:00 1000 mL via INTRAVENOUS

## 2019-05-05 MED ORDER — MIDAZOLAM HCL 2 MG/2ML IJ SOLN
INTRAMUSCULAR | Status: DC | PRN
  Administered 2019-05-05: 2 mg via INTRAVENOUS

## 2019-05-05 MED ORDER — LIDOCAINE HCL (CARDIAC) PF 100 MG/5ML IV SOSY
PREFILLED_SYRINGE | INTRAVENOUS | Status: DC | PRN
  Administered 2019-05-05: 50 mg via INTRAVENOUS

## 2019-05-05 MED ORDER — PROPOFOL 500 MG/50ML IV EMUL
INTRAVENOUS | Status: AC
  Filled 2019-05-05: qty 50

## 2019-05-05 NOTE — Transfer of Care (Signed)
Immediate Anesthesia Transfer of Care Note  Patient: Sara Mercer  Procedure(s) Performed: COLONOSCOPY WITH PROPOFOL (N/A )  Patient Location: PACU and Endoscopy Unit  Anesthesia Type:General  Level of Consciousness: drowsy  Airway & Oxygen Therapy: Patient Spontanous Breathing  Post-op Assessment: Report given to RN and Post -op Vital signs reviewed and stable  Post vital signs: Reviewed and stable  Last Vitals:  Vitals Value Taken Time  BP 108/64 05/05/19 0843  Temp    Pulse 59 05/05/19 0843  Resp 20 05/05/19 0843  SpO2 98 % 05/05/19 0843    Last Pain:  Vitals:   05/05/19 0843  TempSrc:   PainSc: Asleep         Complications: No apparent anesthesia complications

## 2019-05-05 NOTE — Anesthesia Preprocedure Evaluation (Signed)
Anesthesia Evaluation  Patient identified by MRN, date of birth, ID band Patient awake    Reviewed: Allergy & Precautions, H&P , NPO status , Patient's Chart, lab work & pertinent test results, reviewed documented beta blocker date and time   Airway Mallampati: II   Neck ROM: full    Dental  (+) Poor Dentition   Pulmonary neg pulmonary ROS,    Pulmonary exam normal        Cardiovascular Exercise Tolerance: Good negative cardio ROS Normal cardiovascular exam Rhythm:regular Rate:Normal     Neuro/Psych negative neurological ROS  negative psych ROS   GI/Hepatic Neg liver ROS, GERD  Medicated,  Endo/Other  diabetes  Renal/GU negative Renal ROS  negative genitourinary   Musculoskeletal   Abdominal   Peds  Hematology  (+) Blood dyscrasia, anemia ,   Anesthesia Other Findings Past Medical History: No date: Allergy No date: Anemia No date: Chicken pox 08/2011: Fibroadenoma of breast, right     Comment:  by biopsy No date: GERD (gastroesophageal reflux disease) No date: Positive TB test No date: Thalassemia minor Past Surgical History: 08/2011: BREAST BIOPSY; Right     Comment:  negative results 1992: CHOLECYSTECTOMY No date: WISDOM TOOTH EXTRACTION BMI    Body Mass Index: 32.95 kg/m     Reproductive/Obstetrics negative OB ROS                             Anesthesia Physical Anesthesia Plan  ASA: II  Anesthesia Plan: General   Post-op Pain Management:    Induction:   PONV Risk Score and Plan:   Airway Management Planned:   Additional Equipment:   Intra-op Plan:   Post-operative Plan:   Informed Consent: I have reviewed the patients History and Physical, chart, labs and discussed the procedure including the risks, benefits and alternatives for the proposed anesthesia with the patient or authorized representative who has indicated his/her understanding and acceptance.      Dental Advisory Given  Plan Discussed with: CRNA  Anesthesia Plan Comments:         Anesthesia Quick Evaluation

## 2019-05-05 NOTE — Anesthesia Post-op Follow-up Note (Signed)
Anesthesia QCDR form completed.        

## 2019-05-05 NOTE — H&P (Signed)
Vonda Antigua, MD [address] [place], [place], [place], [address] [address], [place], [place], [place], [address] Phone: [phone]  Fax: [phone]  Primary Care Physician:  Jearld Fenton, NP   Pre-Procedure History & Physical: HPI:  Sara Mercer is a 53 y.o. female is here for a colonoscopy.   Past Medical History:  Diagnosis Date  . Allergy   . Anemia   . Chicken pox   . 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 GERD (gastroesophageal reflux disease)   . Positive TB test   . Thalassemia minor     Past Surgical History:  Procedure Laterality Date  . BREAST BIOPSY Right 08/2011   negative results  . CHOLECYSTECTOMY  1992  . WISDOM TOOTH EXTRACTION      Prior to Admission medications   Medication Sig Start Date End Date Taking? Authorizing Provider  b complex vitamins tablet Take 1 tablet by mouth daily.   Yes [provider]  Cholecalciferol (VITAMIN D) 2000 units tablet Take 2,000 Units by mouth daily.   Yes [provider]  EPINEPHrine (EPIPEN 2-PAK) 0.3 mg/0.3 mL IJ SOAJ injection Inject 0.3 mLs (0.3 mg total) into the muscle as needed for anaphylaxis. 07/03/18  Yes Deno Etienne, DO  fexofenadine (ALLEGRA) 180 MG tablet Take 1 tablet (180 mg total) by mouth daily. Patient taking differently: Take 180 mg by mouth as needed.  07/30/15  Yes Baity, Coralie Keens, NP  fluticasone (FLONASE) 50 MCG/ACT nasal spray Place 2 sprays into both nostrils daily. 07/30/15  Yes Baity, Coralie Keens, NP  ibuprofen (ADVIL,MOTRIN) 200 MG tablet Take 800 mg by mouth as needed.   Yes [provider]  metFORMIN (GLUCOPHAGE-XR) 500 MG 24 hr tablet Take 1 tablet (500 mg total) by mouth daily with breakfast. MUST SCHEDULE PHYSICAL 01/28/19  Yes Baity, Coralie Keens, NP  Multiple Vitamin (MULTIVITAMIN) tablet Take 1 tablet by mouth daily.   Yes [provider]  omeprazole (PRILOSEC) 20 MG capsule Take 20 mg by mouth as needed.  09/28/17  Yes [provider]    Allergies as of 01/06/2019 - Review Complete 01/05/2019  Allergen Reaction Noted  . Shellfish allergy Anaphylaxis, Hives, Itching, and Swelling 07/04/2018    Family History  Problem Relation Age of Onset  . Hypertension Maternal Grandmother   . Heart disease Maternal Grandmother        heart failure  . Glaucoma Maternal Grandfather   . Diabetes Maternal Grandfather   . Hypertension Maternal Grandfather   . Hypertension Father   . Stroke Father   . Diabetes Mother   . Glaucoma Mother   . Allergic rhinitis Mother   . Allergic rhinitis Brother   . Colon cancer Brother   . Cancer Neg Hx   . Asthma Neg Hx   . Eczema Neg Hx   . Urticaria Neg Hx     Social History   Socioeconomic History  . Marital status: Divorced    Spouse name: Not on file  . Number of children: Not on file  . Years of education: Not on file  . Highest education level: Not on file  Occupational History  . Not on file  Tobacco Use  . Smoking status: Never Smoker  . Smokeless tobacco: Never Used  Substance and Sexual Activity  . Alcohol use: Yes    Alcohol/week: 0.0 standard drinks    Comment: occasional  . Drug use: No  . Sexual activity: Not Currently  Birth control/protection: None  Other Topics Concern  . Not on file  Social History Narrative  . Not on file   Social Determinants of Health   Financial Resource Strain:   . Difficulty of Paying Living Expenses: Not on file  Food Insecurity:   . Worried About Charity fundraiser in the Last Year: Not on file  . Ran Out of Food in the Last Year: Not on file  Transportation Needs:   . Lack of Transportation (Medical): Not on file  . Lack of Transportation (Non-Medical): Not on file  Physical Activity:   . Days of Exercise per Week: Not on file  . Minutes of Exercise per Session: Not on file  Stress:   . Feeling of Stress : Not on file  Social Connections:   . Frequency of Communication with Friends and Family: Not on  file  . Frequency of Social Gatherings with Friends and Family: Not on file  . Attends Religious Services: Not on file  . Active Member of Clubs or Organizations: Not on file  . Attends Archivist Meetings: Not on file  . Marital Status: Not on file  Intimate Partner Violence:   . Fear of Current or Ex-Partner: Not on file  . Emotionally Abused: Not on file  . Physically Abused: Not on file  . Sexually Abused: Not on file    Review of Systems: See HPI, otherwise negative ROS  Physical Exam: BP 131/69   Pulse (!) 58   Temp (!) 97 F (36.1 C) (Temporal)   Resp 16   Ht 5' 4.5" (1.638 m)   Wt 88.5 kg   SpO2 100%   BMI 32.95 kg/m  General:   Alert,  pleasant and cooperative in NAD Head:  Normocephalic and atraumatic. Neck:  Supple; no masses or thyromegaly. Lungs:  Clear throughout to auscultation, normal respiratory effort.    Heart:  +S1, +S2, Regular rate and rhythm, No edema. Abdomen:  Soft, nontender and nondistended. Normal bowel sounds, without guarding, and without rebound.   Neurologic:  Alert and  oriented x4;  grossly normal neurologically.  Impression/Plan: Sara Mercer is here for a colonoscopy to be performed for average risk screening.  Risks, benefits, limitations, and alternatives regarding  colonoscopy have been reviewed with the patient.  Questions have been answered.  All parties agreeable.   Virgel Manifold, MD  05/05/2019, 8:13 AM

## 2019-05-05 NOTE — Op Note (Signed)
[facility] Gastroenterology Patient Name: Sara Mercer Procedure Date: 05/05/2019 8:11 AM MRN: [id number] Account #: [id number] Date of Birth: 1965/09/13 Admit Type: Outpatient Age: 53 Room: [facility] ENDO ROOM 3 Gender: Female Note Status: Finalized Procedure:             Colonoscopy Indications:           Screening for colorectal malignant neoplasm Providers:             Valoria Tamburri B. Bonna Gains MD, MD Referring MD:          Jearld Fenton (Referring MD) Medicines:             Monitored Anesthesia Care Complications:         No immediate complications. Procedure:             Pre-Anesthesia Assessment:                        - Prior to the procedure, a History and Physical was                         performed, and patient medications, allergies and                         sensitivities were reviewed. The patient's tolerance                         of previous anesthesia was reviewed.                        - The risks and benefits of the procedure and the                         sedation options and risks were discussed with the                         patient. All questions were answered and informed                         consent was obtained.                        - Patient identification and proposed procedure were                         verified prior to the procedure by the physician, the                         nurse, the anesthetist and the technician. The                         procedure was verified in the pre-procedure area in                         the procedure room in the endoscopy suite.                        - ASA Grade Assessment: II - A patient with mild  systemic disease.                        - After reviewing the risks and benefits, the patient                         was deemed in satisfactory condition to undergo the                         procedure.                        After obtaining informed consent, the  colonoscope was                         passed under direct vision. Throughout the procedure,                         the patient's blood pressure, pulse, and oxygen                         saturations were monitored continuously. The                         Colonoscope was introduced through the anus and                         advanced to the the cecum, identified by appendiceal                         orifice and ileocecal valve. The colonoscopy was                         performed with ease. The patient tolerated the                         procedure well. The quality of the bowel preparation                         was good. Findings:      The perianal and digital rectal examinations were normal.      The rectum, sigmoid colon, descending colon, transverse colon, ascending       colon and cecum appeared normal.      The retroflexed view of the distal rectum and anal verge was normal and       showed no anal or rectal abnormalities. Impression:            - The rectum, sigmoid colon, descending colon,                         transverse colon, ascending colon and cecum are normal.                        - The distal rectum and anal verge are normal on                         retroflexion view.                        - No  specimens collected. Recommendation:        - Discharge patient to home.                        - Resume previous diet.                        - Continue present medications.                        - Repeat colonoscopy in 10 years for screening                         purposes.                        - Return to primary care physician as previously                         scheduled.                        - The findings and recommendations were discussed with                         the patient.                        - The findings and recommendations were discussed with                         the patient's family.                        - In the future, if  patient develops new symptoms such                         as blood per rectum, abdominal pain, weight loss,                         altered bowel habits or any other reason for concern,                         patient should discuss this with thier PCP as they may                         need a GI referral at that time or evaluation for need                         for colonoscopy earlier than the recommended screening                         colonoscopy.                        In addition, if patient's family history of colon                         cancer changes (no family history at this time) in the  future, earlier screening may be indicated and patient                         should discuss this with PCP as well. Procedure Code(s):     --- Professional ---                        [id number], Colorectal cancer screening; colonoscopy on                         individual not meeting criteria for high risk Diagnosis Code(s):     --- Professional ---                        Z12.11, Encounter for screening for malignant neoplasm                         of colon CPT copyright 2019 [employer]. All rights reserved. The codes documented in this report are preliminary and upon coder review may  be revised to meet current compliance requirements.  Vonda Antigua, MD Margretta Sidle B. Bonna Gains MD, MD 05/05/2019 8:41:42 AM This report has been signed electronically. Number of Addenda: 0 Note Initiated On: 05/05/2019 8:11 AM Scope Withdrawal Time: 0 hours 13 minutes 46 seconds  Total Procedure Duration: 0 hours 19 minutes 25 seconds       [facility]

## 2019-05-06 ENCOUNTER — Encounter: Payer: Self-pay | Admitting: *Deleted

## 2019-05-12 NOTE — Anesthesia Postprocedure Evaluation (Signed)
Anesthesia Post Note  Patient: Sara Mercer  Procedure(s) Performed: COLONOSCOPY WITH PROPOFOL (N/A )  Patient location during evaluation: PACU Anesthesia Type: General Level of consciousness: awake and alert Pain management: pain level controlled Vital Signs Assessment: post-procedure vital signs reviewed and stable Respiratory status: spontaneous breathing, nonlabored ventilation, respiratory function stable and patient connected to nasal cannula oxygen Cardiovascular status: blood pressure returned to baseline and stable Postop Assessment: no apparent nausea or vomiting Anesthetic complications: no     Last Vitals:  Vitals:   05/05/19 0853 05/05/19 0903  BP: 108/76 113/73  Pulse: (!) 59 (!) 55  Resp: 16 15  Temp:    SpO2: 99% 97%    Last Pain:  Vitals:   05/05/19 0903  TempSrc:   PainSc: 0-No pain                 Molli Barrows

## 2019-08-18 ENCOUNTER — Other Ambulatory Visit: Payer: Self-pay | Admitting: Internal Medicine

## 2019-08-18 DIAGNOSIS — Z1231 Encounter for screening mammogram for malignant neoplasm of breast: Secondary | ICD-10-CM

## 2019-08-19 ENCOUNTER — Telehealth: Payer: Self-pay | Admitting: Internal Medicine

## 2019-08-19 NOTE — Telephone Encounter (Signed)
Called pt and left message that we received her request for new patient appointment with Dr. Olivia Mackie. I let her know on vmail that Dr. Olivia Mackie isn't accepting new patients and the only provider we have is a np named Dawson Bills. We would also need approval from [facility] since she is already established with them. If she still wants to make appt to call us back and let us know.

## 2019-09-02 ENCOUNTER — Ambulatory Visit
Admission: RE | Admit: 2019-09-02 | Discharge: 2019-09-02 | Disposition: A | Discharge status: Home / Self Care | Payer: BC Managed Care – PPO | Source: Ambulatory Visit

## 2019-09-02 ENCOUNTER — Other Ambulatory Visit: Payer: Self-pay

## 2019-09-02 DIAGNOSIS — Z1231 Encounter for screening mammogram for malignant neoplasm of breast: Secondary | ICD-10-CM | POA: Diagnosis not present

## 2019-12-12 DIAGNOSIS — M654 Radial styloid tenosynovitis [de Quervain]: Secondary | ICD-10-CM | POA: Diagnosis not present

## 2019-12-12 DIAGNOSIS — G5621 Lesion of ulnar nerve, right upper limb: Secondary | ICD-10-CM | POA: Diagnosis not present

## 2020-01-10 DIAGNOSIS — M654 Radial styloid tenosynovitis [de Quervain]: Secondary | ICD-10-CM | POA: Diagnosis not present

## 2020-04-03 ENCOUNTER — Ambulatory Visit: Payer: BC Managed Care – PPO | Attending: Internal Medicine

## 2020-04-03 DIAGNOSIS — Z23 Encounter for immunization: Secondary | ICD-10-CM

## 2020-04-03 NOTE — Progress Notes (Signed)
   Covid-19 Vaccination Clinic  Name:  Sara Mercer    MRN: [id number] DOB: Jan 14, 1966  04/03/2020  [name] was observed post Covid-19 immunization for 30 minutes based on pre-vaccination screening without incident. She was provided with Vaccine Information Sheet and instruction to access the V-Safe system.   [name] was instructed to call 911 with any severe reactions post vaccine: Marland Kitchen Difficulty breathing  . Swelling of face and throat  . A fast heartbeat  . A bad rash all over body  . Dizziness and weakness

## 2020-04-08 ENCOUNTER — Encounter: Payer: Self-pay | Admitting: Internal Medicine

## 2020-04-09 MED ORDER — EPINEPHRINE 0.3 MG/0.3ML IJ SOAJ: 0.3000 mg | INTRAMUSCULAR | 0 refills | Status: DC | PRN

## 2020-04-10 NOTE — Telephone Encounter (Signed)
Rx has been faxed.

## 2020-04-16 DIAGNOSIS — L089 Local infection of the skin and subcutaneous tissue, unspecified: Secondary | ICD-10-CM | POA: Diagnosis not present

## 2020-04-16 DIAGNOSIS — L658 Other specified nonscarring hair loss: Secondary | ICD-10-CM | POA: Diagnosis not present

## 2020-04-16 DIAGNOSIS — L669 Cicatricial alopecia, unspecified: Secondary | ICD-10-CM | POA: Diagnosis not present

## 2020-04-16 DIAGNOSIS — L659 Nonscarring hair loss, unspecified: Secondary | ICD-10-CM | POA: Diagnosis not present

## 2020-05-14 DIAGNOSIS — Z20822 Contact with and (suspected) exposure to covid-19: Secondary | ICD-10-CM | POA: Diagnosis not present

## 2020-06-08 ENCOUNTER — Other Ambulatory Visit: Payer: BC Managed Care – PPO

## 2020-06-15 ENCOUNTER — Encounter: Payer: BC Managed Care – PPO | Admitting: Internal Medicine

## 2020-06-25 DIAGNOSIS — L669 Cicatricial alopecia, unspecified: Secondary | ICD-10-CM | POA: Diagnosis not present

## 2020-06-25 DIAGNOSIS — L668 Other cicatricial alopecia: Secondary | ICD-10-CM | POA: Diagnosis not present

## 2020-06-25 DIAGNOSIS — L089 Local infection of the skin and subcutaneous tissue, unspecified: Secondary | ICD-10-CM | POA: Diagnosis not present

## 2020-06-25 DIAGNOSIS — L658 Other specified nonscarring hair loss: Secondary | ICD-10-CM | POA: Diagnosis not present

## 2020-06-25 DIAGNOSIS — L299 Pruritus, unspecified: Secondary | ICD-10-CM | POA: Diagnosis not present

## 2020-07-13 ENCOUNTER — Encounter: Payer: BC Managed Care – PPO | Admitting: Internal Medicine

## 2020-08-08 ENCOUNTER — Other Ambulatory Visit: Payer: Self-pay | Admitting: Internal Medicine

## 2020-08-08 DIAGNOSIS — Z1231 Encounter for screening mammogram for malignant neoplasm of breast: Secondary | ICD-10-CM

## 2020-08-12 IMAGING — MG DIGITAL SCREENING BILATERAL MAMMOGRAM WITH TOMO AND CAD
8 series · 8 of 24 positions shown · non-contrast
Comparison: Previous exam(s).

CLINICAL DATA: Screening.

EXAM:
DIGITAL SCREENING BILATERAL MAMMOGRAM WITH TOMO AND CAD

[L CC synth-2D]
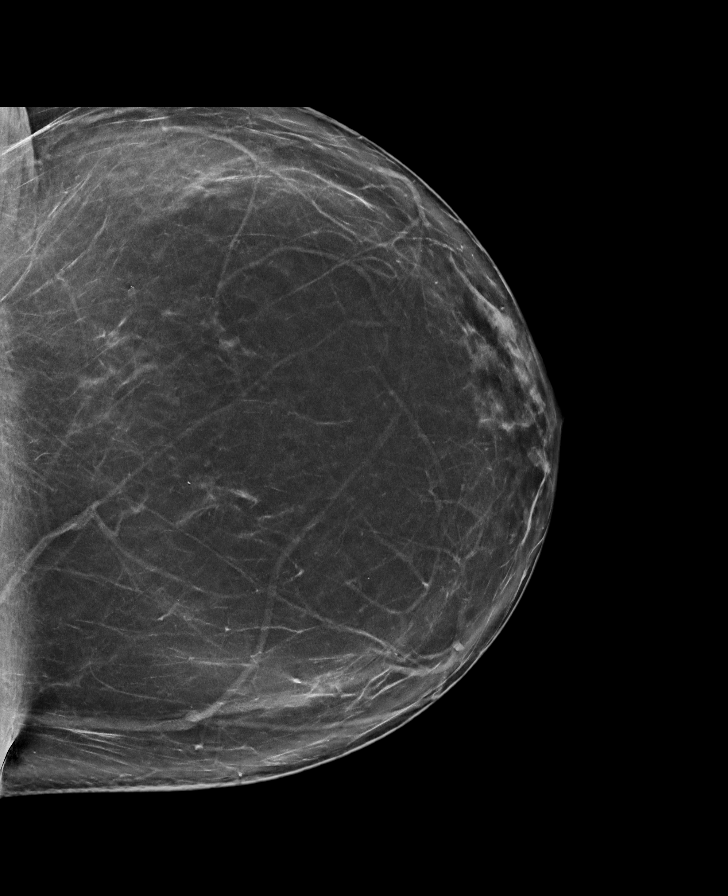

[L MLO synth-2D]
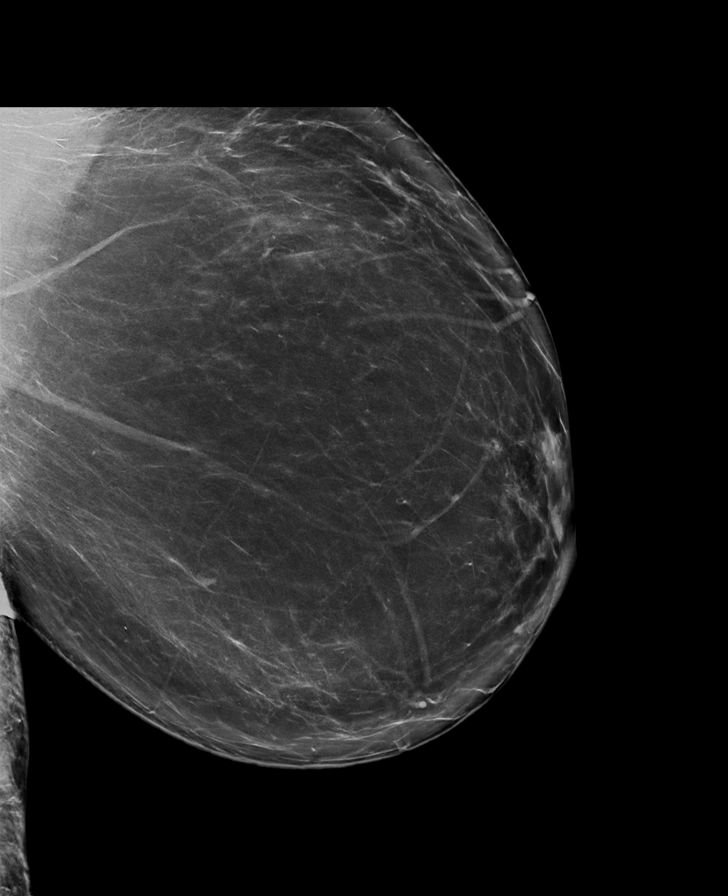

[R CC synth-2D]
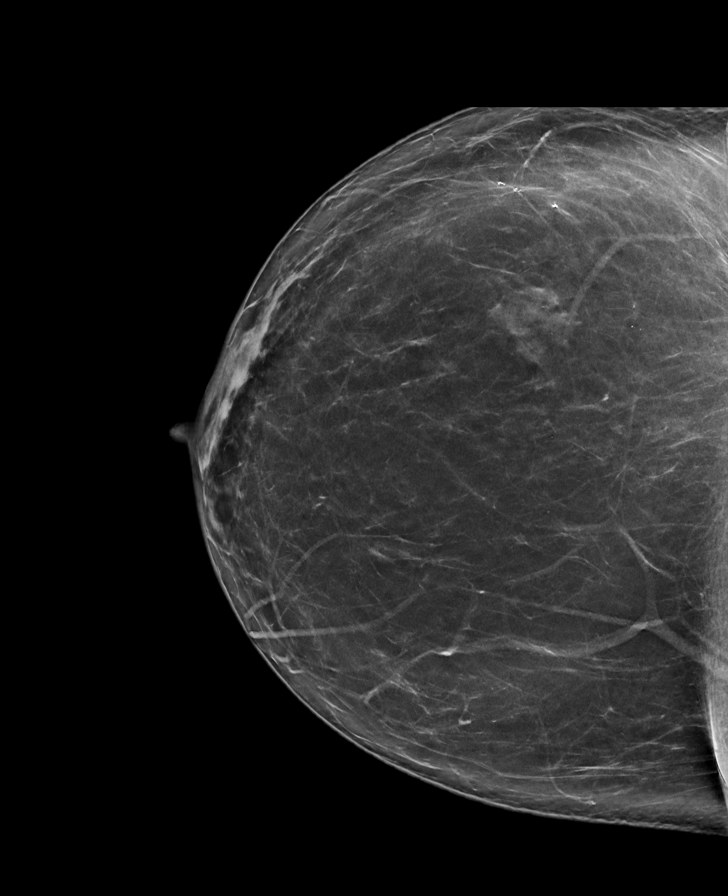

[R MLO synth-2D]
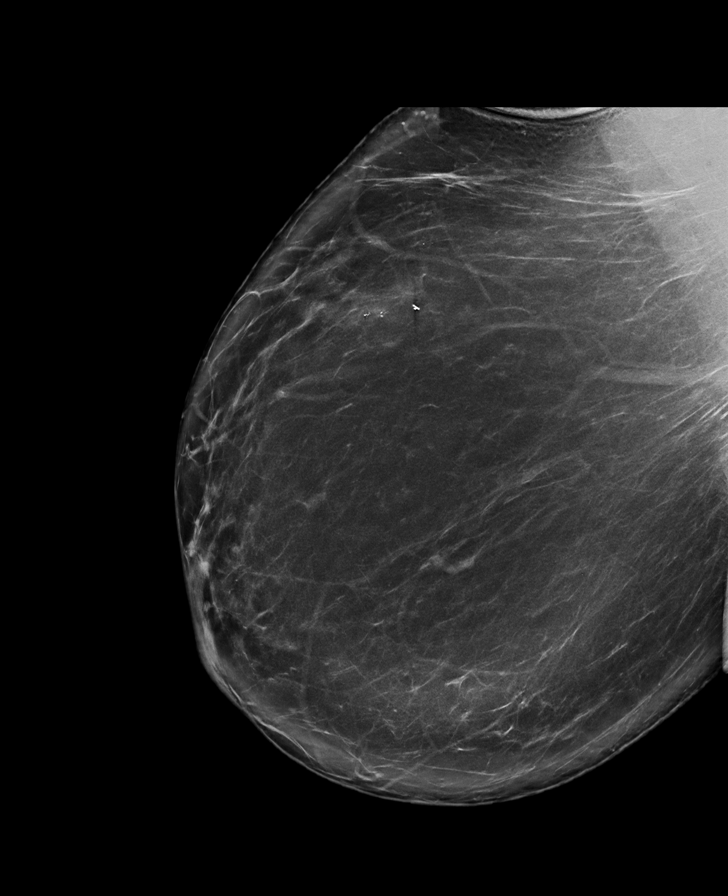

[R MLO tomo · tomo slice 55/108.0]
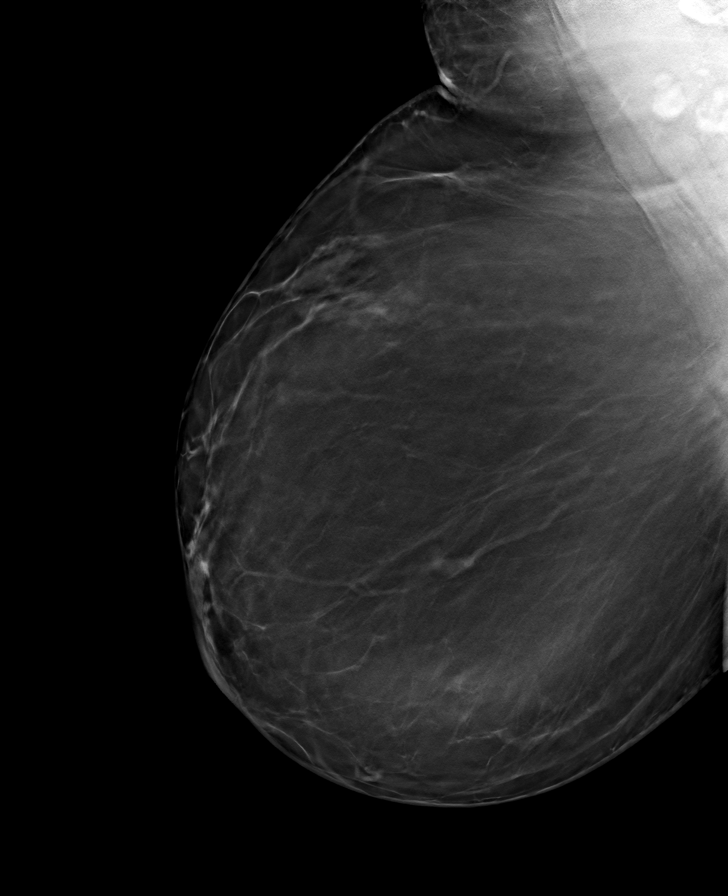

[L MLO tomo · tomo slice 53/105.0]
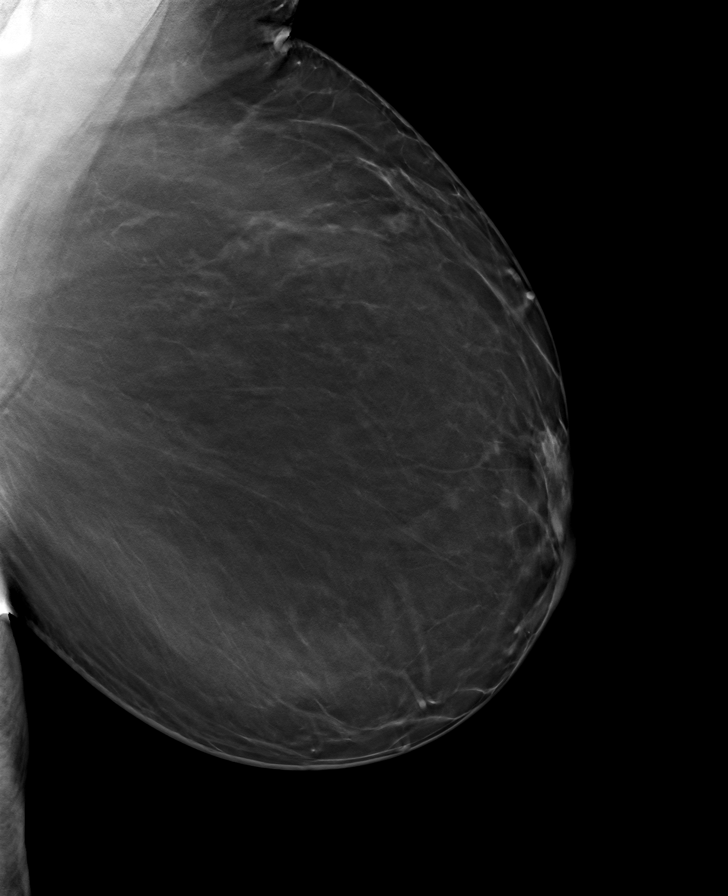

[R CC tomo · tomo slice 46/91.0]
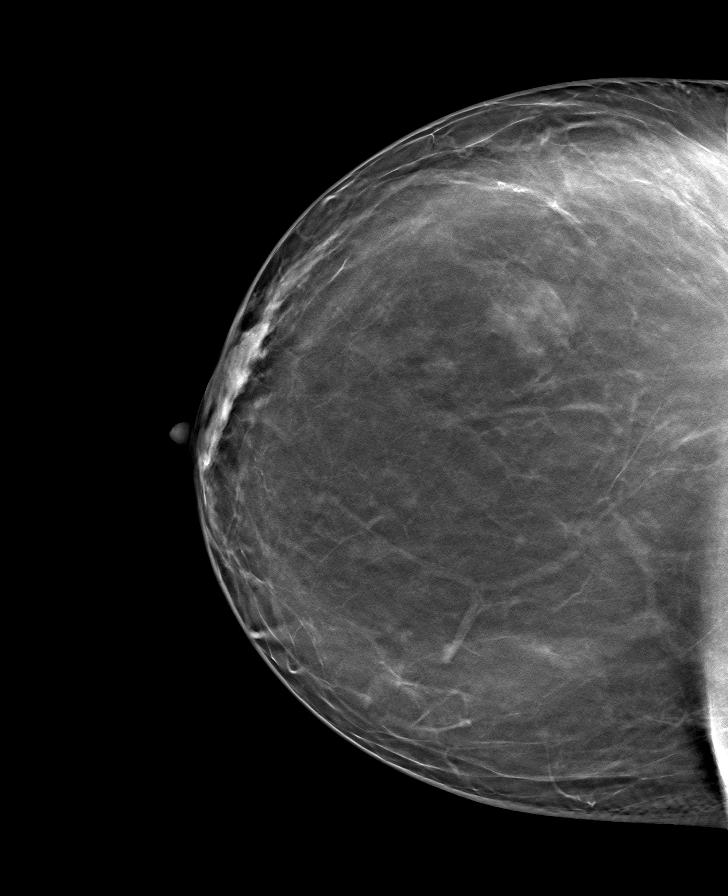

[L CC tomo · tomo slice 49/96.0]
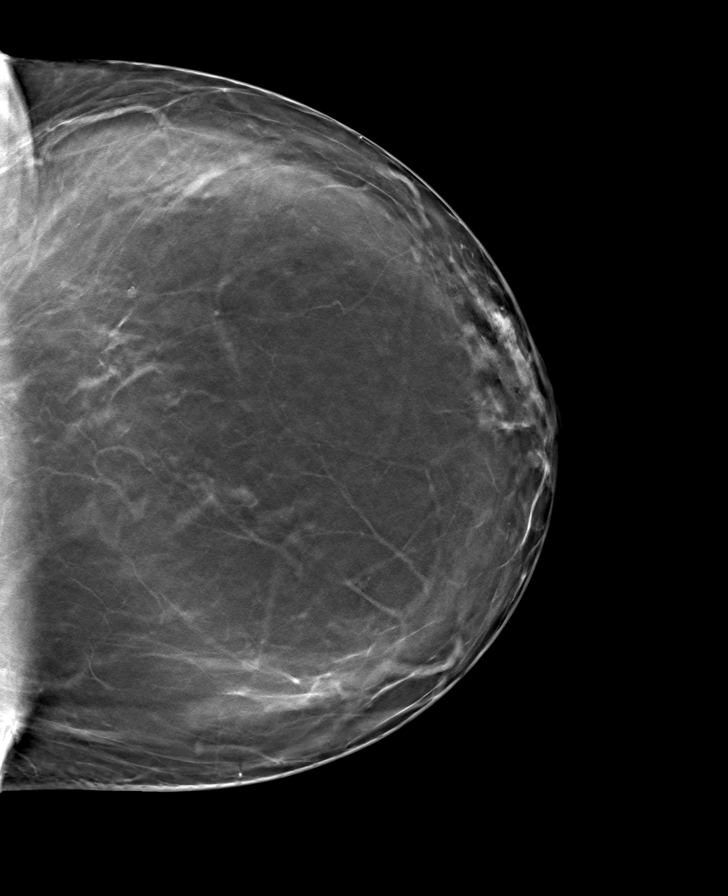

[8 of 24 positions shown; findings below may reference images not displayed]

ACR Breast Density Category b: There are scattered areas of
fibroglandular density.
FINDINGS: There are no findings suspicious for malignancy. Images were
processed with CAD.
IMPRESSION: No mammographic evidence of malignancy. A result letter of this
screening mammogram will be mailed directly to the patient.

RECOMMENDATION:
Screening mammogram in one year. (Code:[id number])

BI-RADS CATEGORY  1: Negative.

## 2020-08-28 DIAGNOSIS — Z6834 Body mass index (BMI) 34.0-34.9, adult: Secondary | ICD-10-CM | POA: Diagnosis not present

## 2020-08-28 DIAGNOSIS — Z01419 Encounter for gynecological examination (general) (routine) without abnormal findings: Secondary | ICD-10-CM | POA: Diagnosis not present

## 2020-08-28 DIAGNOSIS — Z124 Encounter for screening for malignant neoplasm of cervix: Secondary | ICD-10-CM | POA: Diagnosis not present

## 2020-09-03 DIAGNOSIS — L669 Cicatricial alopecia, unspecified: Secondary | ICD-10-CM | POA: Diagnosis not present

## 2020-09-03 DIAGNOSIS — Z79899 Other long term (current) drug therapy: Secondary | ICD-10-CM | POA: Diagnosis not present

## 2020-09-03 DIAGNOSIS — L089 Local infection of the skin and subcutaneous tissue, unspecified: Secondary | ICD-10-CM | POA: Diagnosis not present

## 2020-09-03 DIAGNOSIS — L658 Other specified nonscarring hair loss: Secondary | ICD-10-CM | POA: Diagnosis not present

## 2020-09-03 DIAGNOSIS — L659 Nonscarring hair loss, unspecified: Secondary | ICD-10-CM | POA: Diagnosis not present

## 2020-09-03 LAB — HM PAP SMEAR: HM Pap smear: NEGATIVE

## 2020-09-05 DIAGNOSIS — L669 Cicatricial alopecia, unspecified: Secondary | ICD-10-CM | POA: Insufficient documentation

## 2020-09-05 DIAGNOSIS — L658 Other specified nonscarring hair loss: Secondary | ICD-10-CM | POA: Insufficient documentation

## 2020-10-03 ENCOUNTER — Other Ambulatory Visit: Payer: Self-pay

## 2020-10-03 ENCOUNTER — Ambulatory Visit
Admission: RE | Admit: 2020-10-03 | Discharge: 2020-10-03 | Disposition: A | Discharge status: Home / Self Care | Payer: BC Managed Care – PPO | Source: Ambulatory Visit | Attending: Internal Medicine | Admitting: Internal Medicine

## 2020-10-03 ENCOUNTER — Ambulatory Visit: Payer: BC Managed Care – PPO

## 2020-10-03 DIAGNOSIS — Z1231 Encounter for screening mammogram for malignant neoplasm of breast: Secondary | ICD-10-CM | POA: Diagnosis not present

## 2020-10-17 ENCOUNTER — Ambulatory Visit: Payer: BC Managed Care – PPO | Admitting: Adult Health

## 2020-10-17 ENCOUNTER — Encounter: Payer: Self-pay | Admitting: Adult Health

## 2020-10-17 ENCOUNTER — Other Ambulatory Visit: Payer: Self-pay

## 2020-10-17 VITALS — BP 132/84 | HR 53 | Temp 98.4°F | Ht 64.49 in | Wt 200.4 lb

## 2020-10-17 DIAGNOSIS — E782 Mixed hyperlipidemia: Secondary | ICD-10-CM | POA: Diagnosis not present

## 2020-10-17 DIAGNOSIS — E119 Type 2 diabetes mellitus without complications: Secondary | ICD-10-CM

## 2020-10-17 DIAGNOSIS — D563 Thalassemia minor: Secondary | ICD-10-CM

## 2020-10-17 DIAGNOSIS — E559 Vitamin D deficiency, unspecified: Secondary | ICD-10-CM

## 2020-10-17 LAB — LIPID PANEL
Cholesterol: 187 mg/dL (ref 0–200)
HDL: 51.2 mg/dL (ref >39.00)
LDL Cholesterol: 123 mg/dL — ABNORMAL HIGH (ref 0–99)
NonHDL: 135.61
Total CHOL/HDL Ratio: 4
Triglycerides: 64 mg/dL (ref 0.0–149.0)
VLDL: 12.8 mg/dL (ref 0.0–40.0)

## 2020-10-17 LAB — CBC WITH DIFFERENTIAL/PLATELET
Basophils Absolute: 0 10*3/uL (ref 0.0–0.1)
Basophils Relative: 0.1 % (ref 0.0–3.0)
Eosinophils Absolute: 0.1 10*3/uL (ref 0.0–0.7)
Eosinophils Relative: 1 % (ref 0.0–5.0)
HCT: 33.6 % — ABNORMAL LOW (ref 36.0–46.0)
Hemoglobin: 10.7 g/dL — ABNORMAL LOW (ref 12.0–15.0)
Lymphocytes Relative: 39.7 % (ref 12.0–46.0)
Lymphs Abs: 2.4 10*3/uL (ref 0.7–4.0)
MCHC: 31.8 g/dL (ref 30.0–36.0)
MCV: 70.4 fl — ABNORMAL LOW (ref 78.0–100.0)
Monocytes Absolute: 0.5 10*3/uL (ref 0.1–1.0)
Monocytes Relative: 8.6 % (ref 3.0–12.0)
Neutro Abs: 3 10*3/uL (ref 1.4–7.7)
Neutrophils Relative %: 50.6 % (ref 43.0–77.0)
Platelets: 227 10*3/uL (ref 150.0–400.0)
RBC: 4.77 Mil/uL (ref 3.87–5.11)
RDW: 15.8 % — ABNORMAL HIGH (ref 11.5–15.5)
WBC: 6 10*3/uL (ref 4.0–10.5)

## 2020-10-17 LAB — COMPREHENSIVE METABOLIC PANEL
ALT: 20 U/L (ref 0–35)
AST: 26 U/L (ref 0–37)
Albumin: 4 g/dL (ref 3.5–5.2)
Alkaline Phosphatase: 44 U/L (ref 39–117)
BUN: 10 mg/dL (ref 6–23)
CO2: 27 mEq/L (ref 19–32)
Calcium: 9.5 mg/dL (ref 8.4–10.5)
Chloride: 108 mEq/L (ref 96–112)
Creatinine, Ser: 0.83 mg/dL (ref 0.40–1.20)
GFR: 80.26 mL/min (ref >60.00)
Glucose, Bld: 94 mg/dL (ref 70–99)
Potassium: 4.2 mEq/L (ref 3.5–5.1)
Sodium: 143 mEq/L (ref 135–145)
Total Bilirubin: 0.4 mg/dL (ref 0.2–1.2)
Total Protein: 6.4 g/dL (ref 6.0–8.3)

## 2020-10-17 LAB — HEMOGLOBIN A1C: Hgb A1c MFr Bld: 6.8 % — ABNORMAL HIGH (ref 4.6–6.5)

## 2020-10-17 LAB — TSH: TSH: 1.36 u[IU]/mL (ref 0.35–4.50)

## 2020-10-17 LAB — VITAMIN D 25 HYDROXY (VIT D DEFICIENCY, FRACTURES): VITD: 12.41 ng/mL — ABNORMAL LOW (ref 30.00–100.00)

## 2020-10-17 NOTE — Patient Instructions (Signed)
Health Maintenance, Female Adopting a healthy lifestyle and getting preventive care are important in promoting health and wellness. Ask your health care provider about:  The right schedule for you to have regular tests and exams.  Things you can do on your own to prevent diseases and keep yourself healthy. What should I know about diet, weight, and exercise? Eat a healthy diet  Eat a diet that includes plenty of vegetables, fruits, low-fat dairy products, and lean protein.  Do not eat a lot of foods that are high in solid fats, added sugars, or sodium.   Maintain a healthy weight Body mass index (BMI) is used to identify weight problems. It estimates body fat based on height and weight. Your health care provider can help determine your BMI and help you achieve or maintain a healthy weight. Get regular exercise Get regular exercise. This is one of the most important things you can do for your health. Most adults should:  Exercise for at least 150 minutes each week. The exercise should increase your heart rate and make you sweat (moderate-intensity exercise).  Do strengthening exercises at least twice a week. This is in addition to the moderate-intensity exercise.  Spend less time sitting. Even light physical activity can be beneficial. Watch cholesterol and blood lipids Have your blood tested for lipids and cholesterol at 54 years of age, then have this test every 5 years. Have your cholesterol levels checked more often if:  Your lipid or cholesterol levels are high.  You are older than 54 years of age.  You are at high risk for heart disease. What should I know about cancer screening? Depending on your health history and family history, you may need to have cancer screening at various ages. This may include screening for:  Breast cancer.  Cervical cancer.  Colorectal cancer.  Skin cancer.  Lung cancer. What should I know about heart disease, diabetes, and high blood  pressure? Blood pressure and heart disease  High blood pressure causes heart disease and increases the risk of stroke. This is more likely to develop in people who have high blood pressure readings, are of African descent, or are overweight.  Have your blood pressure checked: ? Every 3-5 years if you are 18-39 years of age. ? Every year if you are 40 years old or older. Diabetes Have regular diabetes screenings. This checks your fasting blood sugar level. Have the screening done:  Once every three years after age 40 if you are at a normal weight and have a low risk for diabetes.  More often and at a younger age if you are overweight or have a high risk for diabetes. What should I know about preventing infection? Hepatitis B If you have a higher risk for hepatitis B, you should be screened for this virus. Talk with your health care provider to find out if you are at risk for hepatitis B infection. Hepatitis C Testing is recommended for:  Everyone born from 1945 through 1965.  Anyone with known risk factors for hepatitis C. Sexually transmitted infections (STIs)  Get screened for STIs, including gonorrhea and chlamydia, if: ? You are sexually active and are younger than 54 years of age. ? You are older than 54 years of age and your health care provider tells you that you are at risk for this type of infection. ? Your sexual activity has changed since you were last screened, and you are at increased risk for chlamydia or gonorrhea. Ask your health care provider   if you are at risk.  Ask your health care provider about whether you are at high risk for HIV. Your health care provider may recommend a prescription medicine to help prevent HIV infection. If you choose to take medicine to prevent HIV, you should first get tested for HIV. You should then be tested every 3 months for as long as you are taking the medicine. Pregnancy  If you are about to stop having your period (premenopausal) and  you may become pregnant, seek counseling before you get pregnant.  Take 400 to 800 micrograms (mcg) of folic acid every day if you become pregnant.  Ask for birth control (contraception) if you want to prevent pregnancy. Osteoporosis and menopause Osteoporosis is a disease in which the bones lose minerals and strength with aging. This can result in bone fractures. If you are 65 years old or older, or if you are at risk for osteoporosis and fractures, ask your health care provider if you should:  Be screened for bone loss.  Take a calcium or vitamin D supplement to lower your risk of fractures.  Be given hormone replacement therapy (HRT) to treat symptoms of menopause. Follow these instructions at home: Lifestyle  Do not use any products that contain nicotine or tobacco, such as cigarettes, e-cigarettes, and chewing tobacco. If you need help quitting, ask your health care provider.  Do not use street drugs.  Do not share needles.  Ask your health care provider for help if you need support or information about quitting drugs. Alcohol use  Do not drink alcohol if: ? Your health care provider tells you not to drink. ? You are pregnant, may be pregnant, or are planning to become pregnant.  If you drink alcohol: ? Limit how much you use to 0-1 drink a day. ? Limit intake if you are breastfeeding.  Be aware of how much alcohol is in your drink. In the [place] one drink equals one 12 oz bottle of beer (355 mL), one 5 oz glass of wine (148 mL), or one 1 oz glass of hard liquor (44 mL). General instructions  Schedule regular health, dental, and eye exams.  Stay current with your vaccines.  Tell your health care provider if: ? You often feel depressed. ? You have ever been abused or do not feel safe at home. Summary  Adopting a healthy lifestyle and getting preventive care are important in promoting health and wellness.  Follow your health care provider's instructions about healthy  diet, exercising, and getting tested or screened for diseases.  Follow your health care provider's instructions on monitoring your cholesterol and blood pressure. This information is not intended to replace advice given to you by your health care provider. Make sure you discuss any questions you have with your health care provider. Document Revised: 05/05/2018 Document Reviewed: 05/05/2018 Elsevier Patient Education  2021 [employer]  

## 2020-10-17 NOTE — Progress Notes (Signed)
New Patient Office Visit  Subjective:  Patient ID: Sara Mercer, female    DOB: 19-Dec-1965  Age: 55 y.o. MRN: [id number]  CC:  Chief Complaint  Patient presents with  . Transitions Of Care    Patient wants to just establish care    HPI Sara Mercer presents for new patient care. 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 Doing well.  Does have some neck discomfort at times with the extra school work, massage helps, she would like a massage prescription if she can find on that takes her insurance she will let me know.   She is not currently taking Metformin, has been doing dietary and lifestyle.  School has stressed this some but still trying.   Will do labs today fasting.   History of chest pain in past has since resolved felt to be anxiety. She has no symptoms currently and did have a cardiac work up that was negative.  No LMP recorded. Patient is perimenopausal.  [facility] mammogram/ gynecology up to date.  04/2019 repeat colonoscopy 10 years.  Dermatology for alopecia at [facility].   Patient  denies any fever, body aches,chills, rash, chest pain, shortness of breath, nausea, vomiting, or diarrhea.  Denies dizziness, lightheadedness, pre syncopal or syncopal episodes.    Past Medical History:  Diagnosis Date  . Allergy   . Anemia   . Chicken pox   . 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 GERD (gastroesophageal reflux disease)   . Positive TB test   . Thalassemia minor     Past Surgical History:  Procedure Laterality Date  . BREAST BIOPSY Right 08/2011   negative results  . CHOLECYSTECTOMY  1992  . COLONOSCOPY WITH PROPOFOL N/A 05/05/2019   Procedure: COLONOSCOPY WITH PROPOFOL;  Surgeon: Virgel Manifold, MD;  Location: [facility] ENDOSCOPY;  Service: Endoscopy;  Laterality: N/A;  . WISDOM TOOTH EXTRACTION      Family History  Problem Relation Age of Onset  . Hypertension Maternal Grandmother    . Heart disease Maternal Grandmother        heart failure  . Glaucoma Maternal Grandfather   . Diabetes Maternal Grandfather   . Hypertension Maternal Grandfather   . Hypertension Father   . Stroke Father   . Diabetes Mother   . Glaucoma Mother   . Allergic rhinitis Mother   . Allergic rhinitis Brother   . Colon cancer Brother   . Cancer Neg Hx   . Asthma Neg Hx   . Eczema Neg Hx   . Urticaria Neg Hx     Social History   Socioeconomic History  . Marital status: Divorced    Spouse name: Not on file  . Number of children: Not on file  . Years of education: Not on file  . Highest education level: Not on file  Occupational History  . Not on file  Tobacco Use  . Smoking status: Never Smoker  . Smokeless tobacco: Never Used  Vaping Use  . Vaping Use: Never used  Substance and Sexual Activity  . Alcohol use: Yes    Alcohol/week: 0.0 standard drinks    Comment: occasional  . Drug use: No  . Sexual activity: Not Currently    Birth control/protection: None  Other Topics Concern  . Not on file  Social History Narrative  . Not on file   Social Determinants of Health   Financial Resource Strain:  Not on file  Food Insecurity: Not on file  Transportation Needs: Not on file  Physical Activity: Not on file  Stress: Not on file  Social Connections: Not on file  Intimate Partner Violence: Not on file    ROS Review of Systems  Constitutional: Negative.   HENT: Negative.   Respiratory: Negative.   Cardiovascular: Negative.   Gastrointestinal: Negative.   Genitourinary: Negative.   Musculoskeletal: Negative.   Neurological: Negative.   Psychiatric/Behavioral: Negative.     Objective:   Today's Vitals: BP 132/84 (BP Location: Left Arm, Patient Position: Sitting, Cuff Size: Normal)   Pulse (!) 53   Temp 98.4 F (36.9 C)   Ht 5' 4.49" (1.638 m)   Wt 200 lb 6.4 oz (90.9 kg)   SpO2 99%   BMI 33.88 kg/m   Physical Exam Vitals reviewed.  Constitutional:       General: She is not in acute distress.    Appearance: She is well-developed. She is obese. She is not ill-appearing, toxic-appearing or diaphoretic.     Interventions: She is not intubated. HENT:     Head: Normocephalic and atraumatic.     Right Ear: External ear normal.     Left Ear: External ear normal.     Nose: Nose normal.     Mouth/Throat:     Pharynx: No oropharyngeal exudate.  Eyes:     General: Lids are normal. No scleral icterus.       Right eye: No discharge.        Left eye: No discharge.     Conjunctiva/sclera: Conjunctivae normal.     Right eye: Right conjunctiva is not injected. No exudate or hemorrhage.    Left eye: Left conjunctiva is not injected. No exudate or hemorrhage.    Pupils: Pupils are equal, round, and reactive to light.  Neck:     Thyroid: No thyroid mass or thyromegaly.     Vascular: Normal carotid pulses. No carotid bruit, hepatojugular reflux or JVD.     Trachea: Trachea and phonation normal. No tracheal tenderness or tracheal deviation.     Meningeal: Brudzinski's sign and Kernig's sign absent.  Cardiovascular:     Rate and Rhythm: Normal rate and regular rhythm.     Pulses: Normal pulses.          Radial pulses are 2+ on the right side and 2+ on the left side.       Dorsalis pedis pulses are 2+ on the right side and 2+ on the left side.       Posterior tibial pulses are 2+ on the right side and 2+ on the left side.     Heart sounds: Normal heart sounds, S1 normal and S2 normal. Heart sounds not distant. No murmur heard. No friction rub. No gallop.   Pulmonary:     Effort: Pulmonary effort is normal. No tachypnea, bradypnea, accessory muscle usage or respiratory distress. She is not intubated.     Breath sounds: Normal breath sounds. No stridor. No wheezing or rales.  Chest:     Chest wall: No tenderness.  Breasts:     Right: No supraclavicular adenopathy.     Left: No supraclavicular adenopathy.    Abdominal:     General: Bowel sounds are  normal. There is no distension or abdominal bruit.     Palpations: Abdomen is soft. There is no shifting dullness, fluid wave, hepatomegaly, splenomegaly, mass or pulsatile mass.     Tenderness: There is no abdominal tenderness.  There is no guarding or rebound.     Hernia: No hernia is present.  Musculoskeletal:        General: No tenderness or deformity. Normal range of motion.     Cervical back: Full passive range of motion without pain, normal range of motion and neck supple. No edema, erythema or rigidity. No spinous process tenderness or muscular tenderness. Normal range of motion.  Lymphadenopathy:     Head:     Right side of head: No submental, submandibular, tonsillar, preauricular, posterior auricular or occipital adenopathy.     Left side of head: No submental, submandibular, tonsillar, preauricular, posterior auricular or occipital adenopathy.     Cervical: No cervical adenopathy.     Right cervical: No superficial, deep or posterior cervical adenopathy.    Left cervical: No superficial, deep or posterior cervical adenopathy.     Upper Body:     Right upper body: No supraclavicular or pectoral adenopathy.     Left upper body: No supraclavicular or pectoral adenopathy.  Skin:    General: Skin is warm and dry.     Coloration: Skin is not pale.     Findings: No abrasion, bruising, burn, ecchymosis, erythema, lesion, petechiae or rash.     Nails: There is no clubbing.  Neurological:     Mental Status: She is alert and oriented to person, place, and time.     GCS: GCS eye subscore is 4. GCS verbal subscore is 5. GCS motor subscore is 6.     Cranial Nerves: No cranial nerve deficit.     Sensory: No sensory deficit.     Motor: No tremor, atrophy, abnormal muscle tone or seizure activity.     Coordination: Coordination normal.     Gait: Gait normal.     Deep Tendon Reflexes: Reflexes are normal and symmetric. Reflexes normal. Babinski sign absent on the right side. Babinski sign  absent on the left side.     Reflex Scores:      Tricep reflexes are 2+ on the right side and 2+ on the left side.      Bicep reflexes are 2+ on the right side and 2+ on the left side.      Brachioradialis reflexes are 2+ on the right side and 2+ on the left side.      Patellar reflexes are 2+ on the right side and 2+ on the left side.      Achilles reflexes are 2+ on the right side and 2+ on the left side. Psychiatric:        Speech: Speech normal.        Behavior: Behavior normal.        Thought Content: Thought content normal.        Judgment: Judgment normal.     Assessment & Plan:   Problem List Items Addressed This Visit      Endocrine   Type 2 diabetes mellitus without complication, without long-term current use of insulin (HCC)   Relevant Orders   Comprehensive metabolic panel   CBC with Differential/Platelet   TSH   Hemoglobin A1c     Other   Beta thalassemia minor - Primary   Mixed dyslipidemia   Relevant Orders   Lipid panel    Other Visit Diagnoses    Vitamin D deficiency       Relevant Orders   VITAMIN D 25 Hydroxy (Vit-D Deficiency, Fractures)      Outpatient Encounter Medications as of 10/17/2020  Medication Sig  .  b complex vitamins tablet Take 1 tablet by mouth daily.  . Cholecalciferol (VITAMIN D) 2000 units tablet Take 2,000 Units by mouth daily.  . clobetasol (TEMOVATE) 0.05 % external solution Apply topically.  Marland Kitchen EPINEPHrine (EPIPEN 2-PAK) 0.3 mg/0.3 mL IJ SOAJ injection Inject 0.3 mg into the muscle as needed for anaphylaxis.  . fluticasone (FLONASE) 50 MCG/ACT nasal spray Place 2 sprays into both nostrils daily.  Marland Kitchen ibuprofen (ADVIL,MOTRIN) 200 MG tablet Take 800 mg by mouth as needed.  . Multiple Vitamin (MULTIVITAMIN) tablet Take 1 tablet by mouth daily.  Marland Kitchen omeprazole (PRILOSEC) 20 MG capsule Take 20 mg by mouth as needed.   . fexofenadine (ALLEGRA) 180 MG tablet Take 1 tablet (180 mg total) by mouth daily. (Patient taking differently: Take 180  mg by mouth as needed. )  . metFORMIN (GLUCOPHAGE-XR) 500 MG 24 hr tablet Take 1 tablet (500 mg total) by mouth daily with breakfast. MUST SCHEDULE PHYSICAL   No facility-administered encounter medications on file as of 10/17/2020.    Orders Placed This Encounter  Procedures  . Comprehensive metabolic panel  . CBC with Differential/Platelet  . TSH  . Lipid panel  . VITAMIN D 25 Hydroxy (Vit-D Deficiency, Fractures)  . Hemoglobin A1c  fasting labs ordered.   Will need urine micro albumin and also urine screening for blood and protein at next visit.   The patient is advised to follow a low fat, low cholesterol diet, attempt to lose weight, decrease or avoid alcohol intake, reduce salt in diet and cooking, reduce exposure to stress, use calcium 1 gram daily with Vit D, continue current medications, continue current healthy lifestyle patterns and return for routine annual checkups.  Follow-up: Return if symptoms worsen or fail to improve, for Go to Emergency room/ urgent care if worse, at any time for any worsening symptoms.   Marcille Buffy, FNP

## 2020-10-18 ENCOUNTER — Encounter: Payer: Self-pay | Admitting: Adult Health

## 2020-10-18 ENCOUNTER — Other Ambulatory Visit (INDEPENDENT_AMBULATORY_CARE_PROVIDER_SITE_OTHER): Payer: BC Managed Care – PPO

## 2020-10-18 ENCOUNTER — Other Ambulatory Visit: Payer: Self-pay | Admitting: *Deleted

## 2020-10-18 ENCOUNTER — Other Ambulatory Visit: Payer: Self-pay | Admitting: Adult Health

## 2020-10-18 DIAGNOSIS — D649 Anemia, unspecified: Secondary | ICD-10-CM

## 2020-10-18 DIAGNOSIS — E559 Vitamin D deficiency, unspecified: Secondary | ICD-10-CM | POA: Diagnosis not present

## 2020-10-18 LAB — IBC + FERRITIN
Ferritin: 117.1 ng/mL (ref 10.0–291.0)
Iron: 64 ug/dL (ref 42–145)
Saturation Ratios: 19 % — ABNORMAL LOW (ref 20.0–50.0)
Transferrin: 240 mg/dL (ref 212.0–360.0)

## 2020-10-18 LAB — VITAMIN D 25 HYDROXY (VIT D DEFICIENCY, FRACTURES): VITD: 15.78 ng/mL — ABNORMAL LOW (ref 30.00–100.00)

## 2020-10-18 MED ORDER — VITAMIN D (ERGOCALCIFEROL) 1.25 MG (50000 UNIT) PO CAPS: 50000.0000 [IU] | ORAL_CAPSULE | ORAL | 0 refills | Status: DC

## 2020-10-18 NOTE — Progress Notes (Signed)
Iron levels ok.

## 2020-10-18 NOTE — Progress Notes (Signed)
Orders Placed This Encounter  Procedures  . VITAMIN D 25 Hydroxy (Vit-D Deficiency, Fractures)    Standing Status:   Future    Standing Expiration Date:   10/18/2021   Orders Placed This Encounter  Procedures  . VITAMIN D 25 Hydroxy (Vit-D Deficiency, Fractures)

## 2020-10-18 NOTE — Progress Notes (Signed)
Add on lab if able : see below:   CBC shows low hemoglobin, please add on IRON TIBC FERRITIN to current labs or have patient come in for redraw.  CMP within normal limits.  LDL elevated.  Discuss lifestyle modification with patient e.g. increase exercise, fiber, fruits, vegetables, lean meat, and omega 3/fish intake and decrease saturated fat.  If patient following strict diet and exercise program already please schedule follow up appointment with primary care physician   TSH for thyroid within normal limits.   Vitamin  D is low, this can contribute to poor sleep and fatigue, will send in prescription for Vitamin D at 50,000 units by mouth once every 7 days/(once weekly) for 12 weeks. Advise recheck lab Vitamin D in 1-2 weeks after completing vitamin d prescription. Labs need to be scheduled.    Hemoglobin A1C is 6.8 was 6.6 2 years ago, does she want to consider medication or try diet and exercise and recheck again in 3 month A1C?

## 2020-10-29 DIAGNOSIS — L658 Other specified nonscarring hair loss: Secondary | ICD-10-CM | POA: Diagnosis not present

## 2020-10-29 DIAGNOSIS — L669 Cicatricial alopecia, unspecified: Secondary | ICD-10-CM | POA: Diagnosis not present

## 2020-10-29 DIAGNOSIS — L089 Local infection of the skin and subcutaneous tissue, unspecified: Secondary | ICD-10-CM | POA: Diagnosis not present

## 2020-10-29 DIAGNOSIS — Z79899 Other long term (current) drug therapy: Secondary | ICD-10-CM | POA: Diagnosis not present

## 2020-10-29 DIAGNOSIS — L659 Nonscarring hair loss, unspecified: Secondary | ICD-10-CM | POA: Diagnosis not present

## 2020-11-20 ENCOUNTER — Encounter: Payer: Self-pay | Admitting: Adult Health

## 2020-11-21 ENCOUNTER — Encounter: Payer: Self-pay | Admitting: Internal Medicine

## 2020-11-21 NOTE — Telephone Encounter (Signed)
For your information  

## 2020-12-04 ENCOUNTER — Encounter: Payer: Self-pay | Admitting: Internal Medicine

## 2020-12-04 ENCOUNTER — Other Ambulatory Visit: Payer: Self-pay

## 2020-12-04 ENCOUNTER — Ambulatory Visit: Payer: BC Managed Care – PPO | Admitting: Internal Medicine

## 2020-12-04 VITALS — BP 140/86 | HR 62 | Temp 98.0°F | Ht 64.49 in | Wt 198.6 lb

## 2020-12-04 DIAGNOSIS — E119 Type 2 diabetes mellitus without complications: Secondary | ICD-10-CM

## 2020-12-04 DIAGNOSIS — R030 Elevated blood-pressure reading, without diagnosis of hypertension: Secondary | ICD-10-CM

## 2020-12-04 DIAGNOSIS — R110 Nausea: Secondary | ICD-10-CM

## 2020-12-04 DIAGNOSIS — E785 Hyperlipidemia, unspecified: Secondary | ICD-10-CM

## 2020-12-04 LAB — MICROALBUMIN / CREATININE URINE RATIO
Creatinine,U: 245.9 mg/dL
Microalb Creat Ratio: 0.8 mg/g (ref 0.0–30.0)
Microalb, Ur: 1.8 mg/dL (ref 0.0–1.9)

## 2020-12-04 MED ORDER — OZEMPIC (0.25 OR 0.5 MG/DOSE) 2 MG/1.5ML ~~LOC~~ SOPN: 0.2500 mg | PEN_INJECTOR | SUBCUTANEOUS | 2 refills | Status: DC

## 2020-12-04 MED ORDER — ONDANSETRON HCL 4 MG PO TABS: 4.0000 mg | ORAL_TABLET | Freq: Three times a day (TID) | ORAL | 0 refills | Status: DC | PRN

## 2020-12-04 NOTE — Telephone Encounter (Signed)
Please advise 

## 2020-12-04 NOTE — Addendum Note (Signed)
Addended by: Orland Mustard on: 12/04/2020 10:00 AM   Modules accepted: Orders

## 2020-12-04 NOTE — Patient Instructions (Addendum)
Semaglutide injection solution What is this medication? SEMAGLUTIDE (Sem a GLOO tide) is used to improve blood sugar control in adults with type 2 diabetes. This medicine may be used with other diabetes medicines. This drug may also reduce the risk of heart attack or stroke if you have type 2diabetes and risk factors for heart disease. This medicine may be used for other purposes; ask your health care provider orpharmacist if you have questions. COMMON BRAND NAME(S): OZEMPIC What should I tell my care team before I take this medication? They need to know if you have any of these conditions: endocrine tumors (MEN 2) or if someone in your family had these tumors eye disease, vision problems history of pancreatitis kidney disease stomach problems thyroid cancer or if someone in your family had thyroid cancer an unusual or allergic reaction to semaglutide, other medicines, foods, dyes, or preservatives pregnant or trying to get pregnant breast-feeding How should I use this medication? This medicine is for injection under the skin of your upper leg (thigh), stomach area, or upper arm. It is given once every week (every 7 days). You will be taught how to prepare and give this medicine. Use exactly as directed. Take your medicine at regular intervals. Do not take it more often thandirected. If you use this medicine with insulin, you should inject this medicine and the insulin separately. Do not mix them together. Do not give the injections rightnext to each other. Change (rotate) injection sites with each injection. It is important that you put your used needles and syringes in a special sharps container. Do not put them in a trash can. If you do not have a sharpscontainer, call your pharmacist or healthcare provider to get one. A special MedGuide will be given to you by the pharmacist with eachprescription and refill. Be sure to read this information carefully each time. This drug comes with  INSTRUCTIONS FOR USE. Ask your pharmacist for directions on how to use this drug. Read the information carefully. Talk to yourpharmacist or health care provider if you have questions. Talk to your pediatrician regarding the use of this medicine in children.Special care may be needed. Overdosage: If you think you have taken too much of this medicine contact apoison control center or emergency room at once. NOTE: This medicine is only for you. Do not share this medicine with others. What if I miss a dose? If you miss a dose, take it as soon as you can within 5 days after the missed dose. Then take your next dose at your regular weekly time. If it has been longer than 5 days after the missed dose, do not take the missed dose. Take the next dose at your regular time. Do not take double or extra doses. If you havequestions about a missed dose, contact your health care provider for advice. What may interact with this medication? other medicines for diabetes Many medications may cause changes in blood sugar, these include: alcohol containing beverages antiviral medicines for HIV or AIDS aspirin and aspirin-like drugs certain medicines for blood pressure, heart disease, irregular heart beat chromium diuretics female hormones, such as estrogens or progestins, birth control pills fenofibrate gemfibrozil isoniazid lanreotide female hormones or anabolic steroids MAOIs like Carbex, Eldepryl, Marplan, Nardil, and Parnate medicines for weight loss medicines for allergies, asthma, cold, or cough medicines for depression, anxiety, or psychotic disturbances niacin nicotine NSAIDs, medicines for pain and inflammation, like ibuprofen or naproxen octreotide pasireotide pentamidine phenytoin probenecid quinolone antibiotics such as ciprofloxacin, levofloxacin, ofloxacin  some herbal dietary supplements steroid medicines such as prednisone or cortisone sulfamethoxazole; trimethoprim thyroid hormones Some  medications can hide the warning symptoms of low blood sugar (hypoglycemia). You may need to monitor your blood sugar more closely if youare taking one of these medications. These include: beta-blockers, often used for high blood pressure or heart problems (examples include atenolol, metoprolol, propranolol) clonidine guanethidine reserpine This list may not describe all possible interactions. Give your health care provider a list of all the medicines, herbs, non-prescription drugs, or dietary supplements you use. Also tell them if you smoke, drink alcohol, or use illegaldrugs. Some items may interact with your medicine. What should I watch for while using this medication? Visit your doctor or health care professional for regular checks on yourprogress. Drink plenty of fluids while taking this medicine. Check with your doctor or health care professional if you get an attack of severe diarrhea, nausea, and vomiting. The loss of too much body fluid can make it dangerous for you to takethis medicine. A test called the HbA1C (A1C) will be monitored. This is a simple blood test. It measures your blood sugar control over the last 2 to 3 months. You willreceive this test every 3 to 6 months. Learn how to check your blood sugar. Learn the symptoms of low and high bloodsugar and how to manage them. Always carry a quick-source of sugar with you in case you have symptoms of low blood sugar. Examples include hard sugar candy or glucose tablets. Make sure others know that you can choke if you eat or drink when you develop serious symptoms of low blood sugar, such as seizures or unconsciousness. They must getmedical help at once. Tell your doctor or health care professional if you have high blood sugar. You might need to change the dose of your medicine. If you are sick or exercisingmore than usual, you might need to change the dose of your medicine. Do not skip meals. Ask your doctor or health care professional if  you should avoid alcohol. Many nonprescription cough and cold products contain sugar oralcohol. These can affect blood sugar. Pens should never be shared. Even if the needle is changed, sharing may resultin passing of viruses like hepatitis or HIV. Wear a medical ID bracelet or chain, and carry a card that describes yourdisease and details of your medicine and dosage times. Do not become pregnant while taking this medicine. Women should inform their doctor if they wish to become pregnant or think they might be pregnant. There is a potential for serious side effects to an unborn child. Talk to your healthcare professional or pharmacist for more information. What side effects may I notice from receiving this medication? Side effects that you should report to your doctor or health care professionalas soon as possible: allergic reactions like skin rash, itching or hives, swelling of the face, lips, or tongue breathing problems changes in vision diarrhea that continues or is severe lump or swelling on the neck severe nausea signs and symptoms of infection like fever or chills; cough; sore throat; pain or trouble passing urine signs and symptoms of low blood sugar such as feeling anxious, confusion, dizziness, increased hunger, unusually weak or tired, sweating, shakiness, cold, irritable, headache, blurred vision, fast heartbeat, loss of consciousness signs and symptoms of kidney injury like trouble passing urine or change in the amount of urine trouble swallowing unusual stomach upset or pain vomiting Side effects that usually do not require medical attention (report to yourdoctor or health care professional  if they continue or are bothersome): constipation diarrhea nausea pain, redness, or irritation at site where injected stomach upset This list may not describe all possible side effects. Call your doctor for medical advice about side effects. You may report side effects to FDA  at1-800-FDA-1088. Where should I keep my medication? Keep out of the reach of children. Store unopened pens in a refrigerator between 2 and 8 degrees C (36 and 46 degrees F). Do not freeze. Protect from light and heat. After you first use the pen, it can be stored for 56 days at room temperature between 15 and 30 degrees C (59 and 86 degrees F) or in a refrigerator. Throw away your used pen after 56days or after the expiration date, whichever comes first. Do not store your pen with the needle attached. If the needle is left on,medicine may leak from the pen. NOTE: This sheet is a summary. It may not cover all possible information. If you have questions about this medicine, talk to your doctor, pharmacist, orhealth care provider.  2022 Elsevier/Gold Standard (2019-01-25 09:41:51)  Diabetes Mellitus and Nutrition, Adult When you have diabetes, or diabetes mellitus, it is very important to have healthy eating habits because your blood sugar (glucose) levels are greatly affected by what you eat and drink. Eating healthy foods in the right amounts, at about the same times every day, can help you: Control your blood glucose. Lower your risk of heart disease. Improve your blood pressure. Reach or maintain a healthy weight. What can affect my meal plan? Every person with diabetes is different, and each person has different needs for a meal plan. Your health care provider may recommend that you work with a dietitian to make a meal plan that is best for you. Your meal plan may vary depending on factors such as: The calories you need. The medicines you take. Your weight. Your blood glucose, blood pressure, and cholesterol levels. Your activity level. Other health conditions you have, such as heart or kidney disease. How do carbohydrates affect me? Carbohydrates, also called carbs, affect your blood glucose level more than any other type of food. Eating carbs naturally raises the amount of glucose in  your blood. Carb counting is a method for keeping track of how many carbs you eat. Counting carbs is important to keep your blood glucose at a healthy level,especially if you use insulin or take certain oral diabetes medicines. It is important to know how many carbs you can safely have in each meal. This is different for every person. Your dietitian can help you calculate how manycarbs you should have at each meal and for each snack. How does alcohol affect me? Alcohol can cause a sudden decrease in blood glucose (hypoglycemia), especially if you use insulin or take certain oral diabetes medicines. Hypoglycemia can be a life-threatening condition. Symptoms of hypoglycemia, such as sleepiness, dizziness, and confusion, are similar to symptoms of having too much alcohol. Do not drink alcohol if: Your health care provider tells you not to drink. You are pregnant, may be pregnant, or are planning to become pregnant. If you drink alcohol: Do not drink on an empty stomach. Limit how much you use to: 0-1 drink a day for women. 0-2 drinks a day for men. Be aware of how much alcohol is in your drink. In the [place] one drink equals one 12 oz bottle of beer (355 mL), one 5 oz glass of wine (148 mL), or one 1 oz glass of hard liquor (44 mL).  Keep yourself hydrated with water, diet soda, or unsweetened iced tea. Keep in mind that regular soda, juice, and other mixers may contain a lot of sugar and must be counted as carbs. What are tips for following this plan?  Reading food labels Start by checking the serving size on the "Nutrition Facts" label of packaged foods and drinks. The amount of calories, carbs, fats, and other nutrients listed on the label is based on one serving of the item. Many items contain more than one serving per package. Check the total grams (g) of carbs in one serving. You can calculate the number of servings of carbs in one serving by dividing the total carbs by 15. For example, if a  food has 30 g of total carbs per serving, it would be equal to 2 servings of carbs. Check the number of grams (g) of saturated fats and trans fats in one serving. Choose foods that have a low amount or none of these fats. Check the number of milligrams (mg) of salt (sodium) in one serving. Most people should limit total sodium intake to less than 2,300 mg per day. Always check the nutrition information of foods labeled as "low-fat" or "nonfat." These foods may be higher in added sugar or refined carbs and should be avoided. Talk to your dietitian to identify your daily goals for nutrients listed on the label. Shopping Avoid buying canned, pre-made, or processed foods. These foods tend to be high in fat, sodium, and added sugar. Shop around the outside edge of the grocery store. This is where you will most often find fresh fruits and vegetables, bulk grains, fresh meats, and fresh dairy. Cooking Use low-heat cooking methods, such as baking, instead of high-heat cooking methods like deep frying. Cook using healthy oils, such as olive, canola, or sunflower oil. Avoid cooking with butter, cream, or high-fat meats. Meal planning Eat meals and snacks regularly, preferably at the same times every day. Avoid going long periods of time without eating. Eat foods that are high in fiber, such as fresh fruits, vegetables, beans, and whole grains. Talk with your dietitian about how many servings of carbs you can eat at each meal. Eat 4-6 oz (112-168 g) of lean protein each day, such as lean meat, chicken, fish, eggs, or tofu. One ounce (oz) of lean protein is equal to: 1 oz (28 g) of meat, chicken, or fish. 1 egg.  cup (62 g) of tofu. Eat some foods each day that contain healthy fats, such as avocado, nuts, seeds, and fish. What foods should I eat? Fruits Berries. Apples. Oranges. Peaches. Apricots. Plums. Grapes. Mango. Papaya.Pomegranate. Kiwi. Cherries. Vegetables Lettuce. Spinach. Leafy greens,  including kale, chard, collard greens, and mustard greens. Beets. Cauliflower. Cabbage. Broccoli. Carrots. Green beans.Tomatoes. Peppers. Onions. Cucumbers. Brussels sprouts. Grains Whole grains, such as whole-wheat or whole-grain bread, crackers, tortillas,cereal, and pasta. Unsweetened oatmeal. Quinoa. Brown or wild rice. Meats and other proteins Seafood. Poultry without skin. Lean cuts of poultry and beef. Tofu. Nuts. Seeds. Dairy Low-fat or fat-free dairy products such as milk, yogurt, and cheese. The items listed above may not be a complete list of foods and beverages you can eat. Contact a dietitian for more information. What foods should I avoid? Fruits Fruits canned with syrup. Vegetables Canned vegetables. Frozen vegetables with butter or cream sauce. Grains Refined white flour and flour products such as bread, pasta, snack foods, andcereals. Avoid all processed foods. Meats and other proteins Fatty cuts of meat. Poultry with skin. Breaded  or fried meats. Processed meat.Avoid saturated fats. Dairy Full-fat yogurt, cheese, or milk. Beverages Sweetened drinks, such as soda or iced tea. The items listed above may not be a complete list of foods and beverages you should avoid. Contact a dietitian for more information. Questions to ask a health care provider Do I need to meet with a diabetes educator? Do I need to meet with a dietitian? What number can I call if I have questions? When are the best times to check my blood glucose? Where to find more information: American Diabetes Association: diabetes.org Academy of Nutrition and Dietetics: www.eatright.[employer] of Diabetes and Digestive and Kidney Diseases: [employer] Association of Diabetes Care and Education Specialists: www.diabeteseducator.org Summary It is important to have healthy eating habits because your blood sugar (glucose) levels are greatly affected by what you eat and drink. A healthy meal plan  will help you control your blood glucose and maintain a healthy lifestyle. Your health care provider may recommend that you work with a dietitian to make a meal plan that is best for you. Keep in mind that carbohydrates (carbs) and alcohol have immediate effects on your blood glucose levels. It is important to count carbs and to use alcohol carefully. This information is not intended to replace advice given to you by your health care provider. Make sure you discuss any questions you have with your healthcare provider. Document Revised: 04/19/2019 Document Reviewed: 04/19/2019 Elsevier Patient Education  2021 [employer].

## 2020-12-04 NOTE — Progress Notes (Signed)
Chief Complaint  Patient presents with   Diabetes   Medication Management   F/u DM 2  1. Interested in nutrition, not statin or BP for now as BP mostly at goal at home <130/<80, no ARB will monitor BP for now  Eye records [place]  Tried metformin in the past IR and ER caused diarrhea  2. Iron def and b-thal minor    Review of Systems  Constitutional:  Negative for weight loss.  HENT:  Negative for hearing loss.   Eyes:  Negative for blurred vision.  Respiratory:  Negative for shortness of breath.   Cardiovascular:  Negative for chest pain.  Gastrointestinal:  Negative for abdominal pain.  Musculoskeletal:  Negative for falls and joint pain.  Skin:  Negative for rash.  Neurological:  Negative for headaches.  Psychiatric/Behavioral:  Negative for depression.   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   GERD (gastroesophageal reflux disease)    Positive TB test    Thalassemia minor    Past Surgical History:  Procedure Laterality Date   BREAST BIOPSY Right 08/2011   negative results   CHOLECYSTECTOMY  1992   COLONOSCOPY WITH PROPOFOL N/A 05/05/2019   Procedure: COLONOSCOPY WITH PROPOFOL;  Surgeon: Virgel Manifold, MD;  Location: [facility] ENDOSCOPY;  Service: Endoscopy;  Laterality: N/A;   WISDOM TOOTH EXTRACTION     Family History  Problem Relation Age of Onset   Hypertension Maternal Grandmother    Heart disease Maternal Grandmother        heart failure   Glaucoma Maternal Grandfather    Diabetes Maternal Grandfather    Hypertension Maternal Grandfather    Hypertension Father    Stroke Father    Diabetes Mother    Glaucoma Mother    Allergic rhinitis Mother    Allergic rhinitis Brother    Colon cancer Brother    Cancer Neg Hx    Asthma Neg Hx    Eczema Neg Hx    Urticaria Neg Hx    Social History   Socioeconomic History   Marital status: Divorced    Spouse name: Not  on file   Number of children: Not on file   Years of education: Not on file   Highest education level: Not on file  Occupational History   Not on file  Tobacco Use   Smoking status: Never   Smokeless tobacco: Never  Vaping Use   Vaping Use: Never used  Substance and Sexual Activity   Alcohol use: Yes    Alcohol/week: 0.0 standard drinks    Comment: occasional   Drug use: No   Sexual activity: Not Currently    Birth control/protection: None  Other Topics Concern   Not on file  Social History Narrative   PhD [employer] graduates 05/03/21    Social Determinants of Health   Financial Resource Strain: Not on file  Food Insecurity: Not on file  Transportation Needs: Not on file  Physical Activity: Not on file  Stress: Not on file  Social Connections: Not on file  Intimate Partner Violence: Not on file   Current Meds  Medication Sig   Cholecalciferol (VITAMIN D) 2000 units tablet Take 2,000 Units by mouth daily.   clobetasol (TEMOVATE) 0.05 % external solution Apply topically.   EPINEPHrine (EPIPEN 2-PAK) 0.3 mg/0.3 mL IJ SOAJ injection Inject 0.3 mg into the muscle as needed  for anaphylaxis.   fluticasone (FLONASE) 50 MCG/ACT nasal spray Place 2 sprays into both nostrils daily.   ibuprofen (ADVIL,MOTRIN) 200 MG tablet Take 800 mg by mouth as needed.   Multiple Vitamin (MULTIVITAMIN) tablet Take 1 tablet by mouth daily.   omeprazole (PRILOSEC) 20 MG capsule Take 20 mg by mouth as needed.    Semaglutide,0.25 or 0.5MG /DOS, (OZEMPIC, 0.25 OR 0.5 MG/DOSE,) 2 MG/1.5ML SOPN Inject 0.25 mg into the skin once a week.   Vitamin D, Ergocalciferol, (DRISDOL) 1.25 MG (50000 UNIT) CAPS capsule Take 1 capsule (50,000 Units total) by mouth every 7 (seven) days. (taking one tablet per week) schedule lab at  office 1-2 weeks after completing prescription.   Allergies  Allergen Reactions   Shellfish Allergy Anaphylaxis, Hives, Itching and Swelling   Metformin And Related     IR or ER>diarrhea     Recent Results (from the past 2160 hour(s))  Comprehensive metabolic panel     Status: None   Collection Time: 10/17/20  9:34 AM  Result Value Ref Range   Sodium 143 135 - 145 mEq/L   Potassium 4.2 3.5 - 5.1 mEq/L   Chloride 108 96 - 112 mEq/L   CO2 27 19 - 32 mEq/L   Glucose, Bld 94 70 - 99 mg/dL   BUN 10 6 - 23 mg/dL   Creatinine, Ser 0.83 0.40 - 1.20 mg/dL   Total Bilirubin 0.4 0.2 - 1.2 mg/dL   Alkaline Phosphatase 44 39 - 117 U/L   AST 26 0 - 37 U/L   ALT 20 0 - 35 U/L   Total Protein 6.4 6.0 - 8.3 g/dL   Albumin 4.0 3.5 - 5.2 g/dL   GFR 80.26 >60.00 mL/min    Comment: Calculated using the CKD-EPI Creatinine Equation (2021)   Calcium 9.5 8.4 - 10.5 mg/dL  CBC with Differential/Platelet     Status: Abnormal   Collection Time: 10/17/20  9:34 AM  Result Value Ref Range   WBC 6.0 4.0 - 10.5 K/uL   RBC 4.77 3.87 - 5.11 Mil/uL   Hemoglobin 10.7 (L) 12.0 - 15.0 g/dL   HCT 33.6 (L) 36.0 - 46.0 %   MCV 70.4 (L) 78.0 - 100.0 fl   MCHC 31.8 30.0 - 36.0 g/dL   RDW 15.8 (H) 11.5 - 15.5 %   Platelets 227.0 150.0 - 400.0 K/uL   Neutrophils Relative % 50.6 43.0 - 77.0 %   Lymphocytes Relative 39.7 12.0 - 46.0 %   Monocytes Relative 8.6 3.0 - 12.0 %   Eosinophils Relative 1.0 0.0 - 5.0 %   Basophils Relative 0.1 0.0 - 3.0 %   Neutro Abs 3.0 1.4 - 7.7 K/uL   Lymphs Abs 2.4 0.7 - 4.0 K/uL   Monocytes Absolute 0.5 0.1 - 1.0 K/uL   Eosinophils Absolute 0.1 0.0 - 0.7 K/uL   Basophils Absolute 0.0 0.0 - 0.1 K/uL  TSH     Status: None   Collection Time: 10/17/20  9:34 AM  Result Value Ref Range   TSH 1.36 0.35 - 4.50 uIU/mL  Lipid panel     Status: Abnormal   Collection Time: 10/17/20  9:34 AM  Result Value Ref Range   Cholesterol 187 0 - 200 mg/dL    Comment: ATP III Classification       Desirable:  < 200 mg/dL               Borderline High:  200 - 239 mg/dL  High:  > = 240 mg/dL   Triglycerides 64.0 0.0 - 149.0 mg/dL    Comment: Normal:  <150 mg/dLBorderline High:  150 -  199 mg/dL   HDL 51.20 >39.00 mg/dL   VLDL 12.8 0.0 - 40.0 mg/dL   LDL Cholesterol 123 (H) 0 - 99 mg/dL   Total CHOL/HDL Ratio 4     Comment:                Men          Women1/2 Average Risk     3.4          3.3Average Risk          5.0          4.42X Average Risk          9.6          7.13X Average Risk          15.0          11.0                       NonHDL 135.61     Comment: NOTE:  Non-HDL goal should be 30 mg/dL higher than patient's LDL goal (i.e. LDL goal of < 70 mg/dL, would have non-HDL goal of < 100 mg/dL)  VITAMIN D 25 Hydroxy (Vit-D Deficiency, Fractures)     Status: Abnormal   Collection Time: 10/17/20  9:34 AM  Result Value Ref Range   VITD 12.41 (L) 30.00 - 100.00 ng/mL  Hemoglobin A1c     Status: Abnormal   Collection Time: 10/17/20  9:34 AM  Result Value Ref Range   Hgb A1c MFr Bld 6.8 (H) 4.6 - 6.5 %    Comment: Glycemic Control Guidelines for People with Diabetes:Non Diabetic:  <6%Goal of Therapy: <7%Additional Action Suggested:  >8%   IBC + Ferritin     Status: Abnormal   Collection Time: 10/18/20  2:54 PM  Result Value Ref Range   Iron 64 42 - 145 ug/dL   Transferrin 240.0 212.0 - 360.0 mg/dL   Saturation Ratios 19.0 (L) 20.0 - 50.0 %   Ferritin 117.1 10.0 - 291.0 ng/mL  VITAMIN D 25 Hydroxy (Vit-D Deficiency, Fractures)     Status: Abnormal   Collection Time: 10/18/20  2:54 PM  Result Value Ref Range   VITD 15.78 (L) 30.00 - 100.00 ng/mL   Objective  Body mass index is 33.57 kg/m. Wt Readings from Last 3 Encounters:  12/04/20 198 lb 9.6 oz (90.1 kg)  10/17/20 200 lb 6.4 oz (90.9 kg)  05/05/19 195 lb (88.5 kg)   Temp Readings from Last 3 Encounters:  12/04/20 98 F (36.7 C) (Oral)  10/17/20 98.4 F (36.9 C)  05/05/19 (!) 97 F (36.1 C) (Temporal)   BP Readings from Last 3 Encounters:  12/04/20 140/86  10/17/20 132/84  05/05/19 113/73   Pulse Readings from Last 3 Encounters:  12/04/20 62  10/17/20 (!) 53  05/05/19 (!) 55    Physical  Exam Vitals and nursing note reviewed.  Constitutional:      Appearance: Normal appearance. She is well-developed and well-groomed. She is obese.  HENT:     Head: Normocephalic and atraumatic.  Eyes:     Conjunctiva/sclera: Conjunctivae normal.     Pupils: Pupils are equal, round, and reactive to light.  Cardiovascular:     Rate and Rhythm: Normal rate and regular rhythm.     Heart sounds: Normal heart  sounds. No murmur heard. Pulmonary:     Effort: Pulmonary effort is normal.     Breath sounds: Normal breath sounds.  Skin:    General: Skin is warm and moist.  Neurological:     General: No focal deficit present.     Mental Status: She is alert and oriented to person, place, and time. Mental status is at baseline.     Gait: Gait normal.  Psychiatric:        Attention and Perception: Attention and perception normal.        Mood and Affect: Mood and affect normal.        Speech: Speech normal.        Behavior: Behavior normal. Behavior is cooperative.        Thought Content: Thought content normal.        Cognition and Memory: Cognition and memory normal.        Judgment: Judgment normal.    Assessment  Plan  Diabetes mellitus without complication 6.8 8/89/16- Plan: Semaglutide,0.25 or 0.5MG /DOS, (OZEMPIC, 0.25 OR 0.5 MG/DOSE,) 2 MG/1.5ML SOPN, Referral to Nutrition and Diabetes Services, Microalbumin / creatinine urine ratio Records [place]   Hyperlipidemia, unspecified hyperlipidemia type - Plan: Semaglutide,0.25 or 0.5MG /DOS, (OZEMPIC, 0.25 OR 0.5 MG/DOSE,) 2 MG/1.5ML SOPN, Referral to Nutrition and Diabetes Services Declines statin for now   Elevated blood pressure reading - Plan: Semaglutide,0.25 or 0.5MG /DOS, (OZEMPIC, 0.25 OR 0.5 MG/DOSE,) 2 MG/1.5ML SOPN, Referral to Nutrition and Diabetes Services  Declines ARB will monitor BP f/u with PCP in 3 months for labs   Provider: Dr. Olivia Mackie McLean-Scocuzza-Internal Medicine

## 2020-12-26 ENCOUNTER — Telehealth: Payer: Self-pay

## 2020-12-26 NOTE — Telephone Encounter (Signed)
Release of information to [facility] - Dr. Rick Duff has been faxed to [phone]. Fax confirmation has been received. ROI has been sent to scan.

## 2021-01-07 DIAGNOSIS — L089 Local infection of the skin and subcutaneous tissue, unspecified: Secondary | ICD-10-CM | POA: Diagnosis not present

## 2021-01-07 DIAGNOSIS — L669 Cicatricial alopecia, unspecified: Secondary | ICD-10-CM | POA: Diagnosis not present

## 2021-02-06 ENCOUNTER — Other Ambulatory Visit: Payer: Self-pay

## 2021-02-06 ENCOUNTER — Encounter: Payer: Self-pay | Admitting: Dietician

## 2021-02-06 ENCOUNTER — Encounter: Payer: BC Managed Care – PPO | Attending: Internal Medicine | Admitting: Dietician

## 2021-02-06 VITALS — Ht 64.5 in | Wt 198.2 lb

## 2021-02-06 DIAGNOSIS — E119 Type 2 diabetes mellitus without complications: Secondary | ICD-10-CM

## 2021-02-06 DIAGNOSIS — E785 Hyperlipidemia, unspecified: Secondary | ICD-10-CM | POA: Diagnosis not present

## 2021-02-06 DIAGNOSIS — R030 Elevated blood-pressure reading, without diagnosis of hypertension: Secondary | ICD-10-CM

## 2021-02-06 NOTE — Patient Instructions (Signed)
Work on a structured eating pattern, ideally plan to eat a meal or snack every 3-5 hours during the day.  Plan time to pre-prep snacks and meals/ meal ingredients.  Great job working on regular exercise, keep it up!

## 2021-02-06 NOTE — Progress Notes (Signed)
Medical Nutrition Therapy: Visit start time: 0830  end time: 0930  Assessment:  Diagnosis: Type 2 DM, hyperlipidemia, elevated BP Past medical history: obesity Psychosocial issues/ stress concerns: none  Preferred learning method:  Auditory Hands-on   Current weight: 198.2lbs Height: 5'4.5" BMI: 33.5 Medications, supplements: reconciled list in medical record  Progress and evaluation:  Recent HbA1C was 6.8%; Total cholesterol 187, HDL 51, LDL 123, Triglycerides 64; vitamin D low at 15.78 ng/ml (10/17/20) Has Ozempic rx, has not yet started due to several issues; plans to start this weekend. She is testing BGs fasting, with typical results <100. She plans to begin testing some post-meal BGs also. Patient reports recent diet changes, including decreasing carbs, smaller food portions, and less snacking.  She has increased exercise.  Taking doctoral classes while maintaining work schedule adds challenge with time constraints in regards to cooking, exercise.   Physical activity:  walking/ swimming 45 minutes, 2-3 times a week  Dietary Intake:  Usual eating pattern includes 2-3 meals and 1-2 snacks per day. Dining out frequency: 0-1 meals per week.  Breakfast: sometimes skips-- not hungry; does have coffee daily; occ boiled or scrambled eggs with sausage or lower fat bacon 2 strips,  1-2x a week/ yogurt with fruit +/or granola Snack: none Lunch: usually leftovers meat + carb + veg. Avoids eating out Snack: fruit +/or pistachios/ popcorn/ occ chips/ cheese doodles (pre-portions)/ cheese stick 3x a week Supper: usually at home meat + carb + veg Snack: usually pistachios Beverages: water, coffee with some sugar, sugar free flavored water, rarely zero sugar soda  Nutrition Care Education: Topics covered:  Basic nutrition: basic food groups, appropriate nutrient balance, appropriate meal and snack schedule, general nutrition guidelines    Weight control: importance of low sugar and low fat  choices; portion control strategies including pre-portioning foods; estimated energy needs for weight loss at 1500 kcal, provided guidance for 45% CHO, 25% pro, 30% fat Diabetes:  appropriate meal and snack schedule; appropriate carb intake and balance, healthy carb choices; role of fiber, protein, fat; effects of physical activity; potential for hypoglyemia Advanced nutrition: cooking techniques, recipe substitutions for southern/ African American style cooking and seasoning -- lower fat, lower sodium options. Other: African heritage diet as healthy option emphasizing whole grains, beans, nuts vegetables, and fruits   Nutritional Diagnosis:  [place]-2.2 Altered nutrition-related laboratory As related to Type 2 diabetes, history of hyperlipidemia and elevated BP.  As evidenced by elevated HbA1C, elevated LDL. [place]-3.3 Overweight/obesity As related to history of excess calories and inadequate physical activity.  As evidenced by patient with current BMI of 33.5, working on lifestyle changes to promote weight loss and improve overall health.  Intervention:  Instruction and discussion as noted above. Patient has been working on lifestyle changes and is motivated to continue. Established additional goals for change with direction from patient.  No follow up MNT scheduled at this time; patient will schedule later if needed.  Education Materials given:  Museum/gallery conservator with food lists, sample meal pattern Sample menus Recipe substitutions [employer]) Visit summary with goals to be viewed via MyChart   Learner/ who was taught:  Patient   Level of understanding: Verbalizes/ demonstrates competency   Demonstrated degree of understanding via:   Teach back Learning barriers: None  Willingness to learn/ readiness for change: Eager, change in progress   Monitoring and Evaluation:  Dietary intake, exercise, BG control, blood lipids, BP control, and body weight      follow up: prn

## 2021-03-06 ENCOUNTER — Ambulatory Visit: Payer: BC Managed Care – PPO | Admitting: Adult Health

## 2021-03-08 DIAGNOSIS — H5213 Myopia, bilateral: Secondary | ICD-10-CM | POA: Diagnosis not present

## 2021-03-08 DIAGNOSIS — H524 Presbyopia: Secondary | ICD-10-CM | POA: Diagnosis not present

## 2021-03-08 DIAGNOSIS — E119 Type 2 diabetes mellitus without complications: Secondary | ICD-10-CM | POA: Diagnosis not present

## 2021-03-08 LAB — HM DIABETES EYE EXAM

## 2021-04-05 ENCOUNTER — Encounter: Payer: Self-pay | Admitting: Adult Health

## 2021-04-22 DIAGNOSIS — L658 Other specified nonscarring hair loss: Secondary | ICD-10-CM | POA: Diagnosis not present

## 2021-04-22 DIAGNOSIS — L659 Nonscarring hair loss, unspecified: Secondary | ICD-10-CM | POA: Diagnosis not present

## 2021-04-22 DIAGNOSIS — L669 Cicatricial alopecia, unspecified: Secondary | ICD-10-CM | POA: Diagnosis not present

## 2021-04-22 DIAGNOSIS — L089 Local infection of the skin and subcutaneous tissue, unspecified: Secondary | ICD-10-CM | POA: Diagnosis not present

## 2021-05-15 ENCOUNTER — Other Ambulatory Visit: Payer: Self-pay | Admitting: Internal Medicine

## 2021-05-15 DIAGNOSIS — E119 Type 2 diabetes mellitus without complications: Secondary | ICD-10-CM

## 2021-05-15 DIAGNOSIS — R030 Elevated blood-pressure reading, without diagnosis of hypertension: Secondary | ICD-10-CM

## 2021-05-15 DIAGNOSIS — E785 Hyperlipidemia, unspecified: Secondary | ICD-10-CM

## 2021-06-05 ENCOUNTER — Ambulatory Visit: Payer: BC Managed Care – PPO | Admitting: Internal Medicine

## 2021-06-05 ENCOUNTER — Ambulatory Visit: Payer: BC Managed Care – PPO | Admitting: Adult Health

## 2021-07-23 ENCOUNTER — Encounter: Payer: Self-pay | Admitting: Internal Medicine

## 2021-07-23 ENCOUNTER — Ambulatory Visit: Payer: BC Managed Care – PPO | Admitting: Adult Health

## 2021-07-23 NOTE — Telephone Encounter (Signed)
Please advise, okay to accept transfer of care?

## 2021-07-30 ENCOUNTER — Ambulatory Visit: Payer: BC Managed Care – PPO | Admitting: Adult Health

## 2021-08-26 ENCOUNTER — Ambulatory Visit: Payer: BC Managed Care – PPO | Admitting: Adult Health

## 2021-09-03 ENCOUNTER — Ambulatory Visit: Payer: BC Managed Care – PPO | Admitting: Internal Medicine

## 2021-09-03 ENCOUNTER — Encounter: Payer: Self-pay | Admitting: Internal Medicine

## 2021-09-03 VITALS — BP 118/78 | HR 61 | Temp 97.8°F | Resp 16 | Ht 64.0 in | Wt 195.0 lb

## 2021-09-03 DIAGNOSIS — E785 Hyperlipidemia, unspecified: Secondary | ICD-10-CM

## 2021-09-03 DIAGNOSIS — Z0000 Encounter for general adult medical examination without abnormal findings: Secondary | ICD-10-CM

## 2021-09-03 DIAGNOSIS — E559 Vitamin D deficiency, unspecified: Secondary | ICD-10-CM | POA: Diagnosis not present

## 2021-09-03 DIAGNOSIS — Z1231 Encounter for screening mammogram for malignant neoplasm of breast: Secondary | ICD-10-CM | POA: Diagnosis not present

## 2021-09-03 DIAGNOSIS — E119 Type 2 diabetes mellitus without complications: Secondary | ICD-10-CM | POA: Diagnosis not present

## 2021-09-03 DIAGNOSIS — Z1389 Encounter for screening for other disorder: Secondary | ICD-10-CM

## 2021-09-03 DIAGNOSIS — R030 Elevated blood-pressure reading, without diagnosis of hypertension: Secondary | ICD-10-CM

## 2021-09-03 LAB — CBC WITH DIFFERENTIAL/PLATELET
Basophils Absolute: 0 10*3/uL (ref 0.0–0.1)
Basophils Relative: 0.4 % (ref 0.0–3.0)
Eosinophils Absolute: 0.1 10*3/uL (ref 0.0–0.7)
Eosinophils Relative: 0.9 % (ref 0.0–5.0)
HCT: 35 % — ABNORMAL LOW (ref 36.0–46.0)
Hemoglobin: 11 g/dL — ABNORMAL LOW (ref 12.0–15.0)
Lymphocytes Relative: 38.4 % (ref 12.0–46.0)
Lymphs Abs: 2.5 10*3/uL (ref 0.7–4.0)
MCHC: 31.6 g/dL (ref 30.0–36.0)
MCV: 71.8 fl — ABNORMAL LOW (ref 78.0–100.0)
Monocytes Absolute: 0.5 10*3/uL (ref 0.1–1.0)
Monocytes Relative: 8.3 % (ref 3.0–12.0)
Neutro Abs: 3.3 10*3/uL (ref 1.4–7.7)
Neutrophils Relative %: 52 % (ref 43.0–77.0)
Platelets: 242 10*3/uL (ref 150.0–400.0)
RBC: 4.88 Mil/uL (ref 3.87–5.11)
RDW: 15.5 % (ref 11.5–15.5)
WBC: 6.4 10*3/uL (ref 4.0–10.5)

## 2021-09-03 LAB — COMPREHENSIVE METABOLIC PANEL
ALT: 21 U/L (ref 0–35)
AST: 21 U/L (ref 0–37)
Albumin: 4.3 g/dL (ref 3.5–5.2)
Alkaline Phosphatase: 45 U/L (ref 39–117)
BUN: 13 mg/dL (ref 6–23)
CO2: 28 mEq/L (ref 19–32)
Calcium: 10 mg/dL (ref 8.4–10.5)
Chloride: 108 mEq/L (ref 96–112)
Creatinine, Ser: 0.93 mg/dL (ref 0.40–1.20)
GFR: 69.59 mL/min (ref >60.00)
Glucose, Bld: 79 mg/dL (ref 70–99)
Potassium: 5 mEq/L (ref 3.5–5.1)
Sodium: 144 mEq/L (ref 135–145)
Total Bilirubin: 0.4 mg/dL (ref 0.2–1.2)
Total Protein: 6.7 g/dL (ref 6.0–8.3)

## 2021-09-03 LAB — LIPID PANEL
Cholesterol: 209 mg/dL — ABNORMAL HIGH (ref 0–200)
HDL: 51.8 mg/dL (ref >39.00)
LDL Cholesterol: 134 mg/dL — ABNORMAL HIGH (ref 0–99)
NonHDL: 156.95
Total CHOL/HDL Ratio: 4
Triglycerides: 113 mg/dL (ref 0.0–149.0)
VLDL: 22.6 mg/dL (ref 0.0–40.0)

## 2021-09-03 LAB — HEMOGLOBIN A1C: Hgb A1c MFr Bld: 6.5 % (ref 4.6–6.5)

## 2021-09-03 LAB — VITAMIN D 25 HYDROXY (VIT D DEFICIENCY, FRACTURES): VITD: 15.2 ng/mL — ABNORMAL LOW (ref 30.00–100.00)

## 2021-09-03 MED ORDER — OZEMPIC (0.25 OR 0.5 MG/DOSE) 2 MG/1.5ML ~~LOC~~ SOPN: 0.5000 mg | PEN_INJECTOR | SUBCUTANEOUS | 5 refills | Status: DC

## 2021-09-03 MED ORDER — EPINEPHRINE 0.3 MG/0.3ML IJ SOAJ: 0.3000 mg | INTRAMUSCULAR | 2 refills | Status: DC | PRN

## 2021-09-03 NOTE — Progress Notes (Signed)
Chief Complaint  ?Patient presents with  ? Transfer of Care  ? ?Annual doing well  ?Dm 2 last A1c 6.8 on ozempic 0.25 weekly agreeable to go up on dose  ?Wt stable no change 198 lbs ? ?Review of Systems  ?Constitutional:  Negative for weight loss.  ?HENT:  Negative for hearing loss.   ?Eyes:  Negative for blurred vision.  ?Respiratory:  Negative for shortness of breath.   ?Cardiovascular:  Negative for chest pain.  ?Gastrointestinal:  Negative for abdominal pain and blood in stool.  ?Genitourinary:  Negative for dysuria.  ?Musculoskeletal:  Negative for falls and joint pain.  ?Skin:  Negative for rash.  ?Neurological:  Negative for headaches.  ?Psychiatric/Behavioral:  Negative for depression.   ?Past Medical History:  ?Diagnosis Date  ? Allergy   ? Anemia   ? Chicken pox   ? 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 by biopsy  ? GERD (gastroesophageal reflux disease)   ? Positive TB test   ? Thalassemia minor   ? ?Past Surgical History:  ?Procedure Laterality Date  ? BREAST BIOPSY Right 08/2011  ? negative results  ? CHOLECYSTECTOMY  1992  ? COLONOSCOPY WITH PROPOFOL N/A 05/05/2019  ? Procedure: COLONOSCOPY WITH PROPOFOL;  Surgeon: Virgel Manifold, MD;  Location: [facility] ENDOSCOPY;  Service: Endoscopy;  Laterality: N/A;  ? WISDOM TOOTH EXTRACTION    ? ?Family History  ?Problem Relation Age of Onset  ? Hypertension Maternal Grandmother   ? Heart disease Maternal Grandmother   ?     heart failure  ? Glaucoma Maternal Grandfather   ? Diabetes Maternal Grandfather   ? Hypertension Maternal Grandfather   ? Hypertension Father   ? Stroke Father   ? Diabetes Mother   ? Glaucoma Mother   ? Allergic rhinitis Mother   ? Allergic rhinitis Brother   ? Colon cancer Brother   ? Cancer Neg Hx   ? Asthma Neg Hx   ? Eczema Neg Hx   ? Urticaria Neg Hx   ? ?Social History  ? ?Socioeconomic History  ? Marital status: Divorced  ?  Spouse name: Not on file  ? Number of children: Not on file  ? Years of education: Not on file  ? Highest  education level: Not on file  ?Occupational History  ? Not on file  ?Tobacco Use  ? Smoking status: Never  ? Smokeless tobacco: Never  ?Vaping Use  ? Vaping Use: Never used  ?Substance and Sexual Activity  ? Alcohol use: Yes  ?  Alcohol/week: 0.0 standard drinks  ?  Comment: occasional  ? Drug use: No  ? Sexual activity: Not Currently  ?  Birth control/protection: None  ?Other Topics Concern  ? Not on file  ?Social History Narrative  ? 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 ?Social Determinants of Health  ? ?Financial Resource Strain: Not on file  ?Food Insecurity: Not on file  ?Transportation Needs: Not on file  ?Physical Activity: Not on file  ?Stress: Not on file  ?Social Connections: Not on file  ?Intimate Partner Violence: Not on file  ? ?Current Meds  ?Medication Sig  ? Cholecalciferol (VITAMIN D) 2000 units tablet Take 2,000 Units by mouth daily.  ? clobetasol (TEMOVATE) 0.05 % external solution Apply topically.  ? fluticasone (FLONASE) 50 MCG/ACT nasal spray Place 2 sprays into both nostrils daily.  ? ibuprofen (ADVIL,MOTRIN) 200 MG tablet Take 800 mg by mouth as needed.  ? Multiple Vitamin (MULTIVITAMIN) tablet Take 1 tablet  by mouth daily.  ? omeprazole (PRILOSEC) 20 MG capsule Take 20 mg by mouth as needed.   ? ondansetron (ZOFRAN) 4 MG tablet Take 1 tablet (4 mg total) by mouth every 8 (eight) hours as needed.  ? [DISCONTINUED] EPINEPHrine (EPIPEN 2-PAK) 0.3 mg/0.3 mL IJ SOAJ injection Inject 0.3 mg into the muscle as needed for anaphylaxis.  ? [DISCONTINUED] OZEMPIC, 0.25 OR 0.5 MG/DOSE, 2 MG/1.5ML SOPN INJECT 0.'25MG'$  INTO THE SKIN ONCE A WEEK  ? ?Allergies  ?Allergen Reactions  ? Shellfish Allergy Anaphylaxis, Hives, Itching and Swelling  ? Metformin And Related   ?  IR or ER>diarrhea ?  ? ?No results found for this or any previous visit (from the past 2160 hour(s)). ?Objective  ?Body mass index is 33.47 kg/m?. ?Wt Readings from Last 3 Encounters:  ?09/03/21 195 lb (88.5 kg)  ?02/06/21 198 lb 3.2 oz (89.9 kg)   ?12/04/20 198 lb 9.6 oz (90.1 kg)  ? ?Temp Readings from Last 3 Encounters:  ?09/03/21 97.8 ?F (36.6 ?C) (Oral)  ?12/04/20 98 ?F (36.7 ?C) (Oral)  ?10/17/20 98.4 ?F (36.9 ?C)  ? ?BP Readings from Last 3 Encounters:  ?09/03/21 118/78  ?12/04/20 140/86  ?10/17/20 132/84  ? ?Pulse Readings from Last 3 Encounters:  ?09/03/21 61  ?12/04/20 62  ?10/17/20 (!) 53  ? ? ?Physical Exam ?Vitals and nursing note reviewed.  ?Constitutional:   ?   Appearance: Normal appearance. She is well-developed and well-groomed.  ?HENT:  ?   Head: Normocephalic and atraumatic.  ?Eyes:  ?   Conjunctiva/sclera: Conjunctivae normal.  ?   Pupils: Pupils are equal, round, and reactive to light.  ?Cardiovascular:  ?   Rate and Rhythm: Normal rate and regular rhythm.  ?   Heart sounds: Normal heart sounds. No murmur heard. ?Pulmonary:  ?   Effort: Pulmonary effort is normal.  ?   Breath sounds: Normal breath sounds.  ?Abdominal:  ?   General: Abdomen is flat. Bowel sounds are normal.  ?   Tenderness: There is no abdominal tenderness.  ?Musculoskeletal:     ?   General: No tenderness.  ?Skin: ?   General: Skin is warm and dry.  ?Neurological:  ?   General: No focal deficit present.  ?   Mental Status: She is alert and oriented to person, place, and time. Mental status is at baseline.  ?   Cranial Nerves: Cranial nerves 2-12 are intact.  ?   Motor: Motor function is intact.  ?   Coordination: Coordination is intact.  ?   Gait: Gait is intact.  ?Psychiatric:     ?   Attention and Perception: Attention and perception normal.     ?   Mood and Affect: Mood and affect normal.     ?   Speech: Speech normal.     ?   Behavior: Behavior normal. Behavior is cooperative.     ?   Thought Content: Thought content normal.     ?   Cognition and Memory: Cognition and memory normal.     ?   Judgment: Judgment normal.  ? ? ?Assessment  ?Plan  ?Annual physical exam ?See below  ? ? ?Diabetes mellitus without complication ([place]) - Plan: Comprehensive metabolic panel,  Lipid panel, CBC with Differential/Platelet, Hemoglobin A1c, Semaglutide,0.25 or 0.'5MG'$ /DOS, (OZEMPIC, 0.25 OR 0.5 MG/DOSE,) 2 MG/1.5ML SOPN ?BP controlled w/o meds today  ? ?HM ?Flu shot 02/2021 ?Tdap utd  ?4/4 moderna had 4th dose in 02/2021 or 03/2021  ?Shingrix declines  and prevnar 20 in the future ?[facility] ob[facility]gyn Dr. Constance Haw pap ROI today ?Mammogram 10/03/20 neg ?Colonoscopy 05/05/19 f/u in 10 years ? Mom h/o precancerous polyps or not then change to 5 years  ?Rec healthy diet and exercise  ? ? ?Provider: Dr. Olivia Mackie McLean-Scocuzza-Internal Medicine  ?

## 2021-09-03 NOTE — Patient Instructions (Addendum)
Consider prevnar 20  ? ?Pneumococcal Conjugate Vaccine (Prevnar 20) Suspension for Injection ?What is this medication? ?PNEUMOCOCCAL VACCINE (NEU mo KOK al vak SEEN) is a vaccine. It prevents pneumococcus bacterial infections. These bacteria can cause serious infections like pneumonia, meningitis, and blood infections. This vaccine will not treat an infection and will not cause infection. This vaccine is recommended for adults 18 years and older. ?This medicine may be used for other purposes; ask your health care provider or pharmacist if you have questions. ?COMMON BRAND NAME(S): Prevnar 20 ?What should I tell my care team before I take this medication? ?They need to know if you have any of these conditions: ?bleeding disorder ?fever ?immune system problems ?an unusual or allergic reaction to pneumococcal vaccine, diphtheria toxoid, other vaccines, other medicines, foods, dyes, or preservatives ?pregnant or trying to get pregnant ?breast-feeding ?How should I use this medication? ?This vaccine is injected into a muscle. It is given by a health care provider. ?A copy of Vaccine Information Statements will be given before each vaccination. Be sure to read this information carefully each time. This sheet may change often. ?Talk to your health care provider about the use of this medicine in children. Special care may be needed. ?Overdosage: If you think you have taken too much of this medicine contact a poison control center or emergency room at once. ?NOTE: This medicine is only for you. Do not share this medicine with others. ?What if I miss a dose? ?This does not apply. This medicine is not for regular use. ?What may interact with this medication? ?medicines for cancer chemotherapy ?medicines that suppress your immune function ?steroid medicines like prednisone or cortisone ?This list may not describe all possible interactions. Give your health care provider a list of all the medicines, herbs, non-prescription  drugs, or dietary supplements you use. Also tell them if you smoke, drink alcohol, or use illegal drugs. Some items may interact with your medicine. ?What should I watch for while using this medication? ?Mild fever and pain should go away in 3 days or less. Report any unusual symptoms to your health care provider. ?What side effects may I notice from receiving this medication? ?Side effects that you should report to your doctor or health care professional as soon as possible: ?allergic reactions (skin rash, itching or hives; swelling of the face, lips, or tongue) ?confusion ?fast, irregular heartbeat ?fever over 102 degrees F ?muscle weakness ?seizures ?trouble breathing ?unusual bruising or bleeding ?Side effects that usually do not require medical attention (report to your doctor or health care professional if they continue or are bothersome): ?fever of 102 degrees F or less ?headache ?joint pain ?muscle cramps, pain ?pain, tender at site where injected ?This list may not describe all possible side effects. Call your doctor for medical advice about side effects. You may report side effects to FDA at 1-800-FDA-1088. ?Where should I keep my medication? ?This vaccine is only given by a health care provider. It will not be stored at home. ?NOTE: This sheet is a summary. It may not cover all possible information. If you have questions about this medicine, talk to your doctor, pharmacist, or health care provider. ?? 2022 Elsevier/Gold Standard (2020-01-26 00:00:00) ? ? ?Zoster Vaccine, Recombinant injection x doses  ?What is this medication? ?ZOSTER VACCINE (ZOS ter vak SEEN) is a vaccine used to reduce the risk of getting shingles. This vaccine is not used to treat shingles or nerve pain from shingles. ?This medicine may be used for other purposes;  ask your health care provider or pharmacist if you have questions. ?COMMON BRAND NAME(S): SHINGRIX ?What should I tell my care team before I take this medication? ?They need  to know if you have any of these conditions: ?cancer ?immune system problems ?an unusual or allergic reaction to Zoster vaccine, other medications, foods, dyes, or preservatives ?pregnant or trying to get pregnant ?breast-feeding ?How should I use this medication? ?This vaccine is injected into a muscle. It is given by a health care provider. ?A copy of Vaccine Information Statements will be given before each vaccination. Be sure to read this information carefully each time. This sheet may change often. ?Talk to your health care provider about the use of this vaccine in children. This vaccine is not approved for use in children. ?Overdosage: If you think you have taken too much of this medicine contact a poison control center or emergency room at once. ?NOTE: This medicine is only for you. Do not share this medicine with others. ?What if I miss a dose? ?Keep appointments for follow-up (booster) doses. It is important not to miss your dose. Call your health care provider if you are unable to keep an appointment. ?What may interact with this medication? ?medicines that suppress your immune system ?medicines to treat cancer ?steroid medicines like prednisone or cortisone ?This list may not describe all possible interactions. Give your health care provider a list of all the medicines, herbs, non-prescription drugs, or dietary supplements you use. Also tell them if you smoke, drink alcohol, or use illegal drugs. Some items may interact with your medicine. ?What should I watch for while using this medication? ?Visit your health care provider regularly. ?This vaccine, like all vaccines, may not fully protect everyone. ?What side effects may I notice from receiving this medication? ?Side effects that you should report to your doctor or health care professional as soon as possible: ?allergic reactions (skin rash, itching or hives; swelling of the face, lips, or tongue) ?trouble breathing ?Side effects that usually do not  require medical attention (report these to your doctor or health care professional if they continue or are bothersome): ?chills ?headache ?fever ?nausea ?pain, redness, or irritation at site where injected ?tiredness ?vomiting ?This list may not describe all possible side effects. Call your doctor for medical advice about side effects. You may report side effects to FDA at 1-800-FDA-1088. ?Where should I keep my medication? ?This vaccine is only given by a health care provider. It will not be stored at home. ?NOTE: This sheet is a summary. It may not cover all possible information. If you have questions about this medicine, talk to your doctor, pharmacist, or health care provider. ?? 2022 Elsevier/Gold Standard (2021-01-29 00:00:00) ? ?

## 2021-09-04 LAB — URINALYSIS, ROUTINE W REFLEX MICROSCOPIC
Bilirubin Urine: NEGATIVE
Glucose, UA: NEGATIVE
Hgb urine dipstick: NEGATIVE
Ketones, ur: NEGATIVE
Leukocytes,Ua: NEGATIVE
Nitrite: NEGATIVE
Protein, ur: NEGATIVE
Specific Gravity, Urine: 1.022 (ref 1.001–1.035)
pH: <=5 (ref 5.0–8.0)

## 2021-09-04 MED ORDER — CHOLECALCIFEROL 1.25 MG (50000 UT) PO CAPS: 50000.0000 [IU] | ORAL_CAPSULE | ORAL | 1 refills | Status: DC

## 2021-09-04 NOTE — Addendum Note (Signed)
Addended by: Orland Mustard on: 09/04/2021 07:45 AM ? ? Modules accepted: Orders ? ?

## 2021-09-06 ENCOUNTER — Telehealth: Payer: Self-pay | Admitting: Internal Medicine

## 2021-09-06 NOTE — Telephone Encounter (Signed)
Patient called and stated she spoke to someone at the office about her lab results. She was wondering if Dr Aundra Dubin was going to put her on cholesterol medication.  ?

## 2021-09-09 ENCOUNTER — Other Ambulatory Visit: Payer: Self-pay | Admitting: Internal Medicine

## 2021-09-09 DIAGNOSIS — E785 Hyperlipidemia, unspecified: Secondary | ICD-10-CM

## 2021-09-09 MED ORDER — PRAVASTATIN SODIUM 20 MG PO TABS: 20.0000 mg | ORAL_TABLET | Freq: Every day | ORAL | 3 refills | Status: DC

## 2021-09-09 NOTE — Telephone Encounter (Signed)
Sent pravachol 20 mg at night after 6 pm to pharmacy  ?This is low intensity statin and low dose though the lowest is 10 mg not sure if this dose will help her cholesterol 10 mg where her #s are  ?As always rec healthy diet and exercise ?

## 2021-09-23 ENCOUNTER — Other Ambulatory Visit: Payer: Self-pay | Admitting: Internal Medicine

## 2021-09-23 DIAGNOSIS — Z1231 Encounter for screening mammogram for malignant neoplasm of breast: Secondary | ICD-10-CM

## 2021-11-12 ENCOUNTER — Ambulatory Visit
Admission: RE | Admit: 2021-11-12 | Discharge: 2021-11-12 | Disposition: A | Discharge status: Home / Self Care | Payer: BC Managed Care – PPO | Source: Ambulatory Visit | Attending: Internal Medicine | Admitting: Internal Medicine

## 2021-11-12 DIAGNOSIS — Z1231 Encounter for screening mammogram for malignant neoplasm of breast: Secondary | ICD-10-CM

## 2021-11-29 ENCOUNTER — Encounter: Payer: Self-pay | Admitting: Internal Medicine

## 2021-11-29 ENCOUNTER — Ambulatory Visit (INDEPENDENT_AMBULATORY_CARE_PROVIDER_SITE_OTHER): Payer: BC Managed Care – PPO | Admitting: Internal Medicine

## 2021-11-29 VITALS — BP 120/80 | HR 52 | Temp 98.1°F | Resp 14 | Ht 64.0 in | Wt 187.6 lb

## 2021-11-29 DIAGNOSIS — R140 Abdominal distension (gaseous): Secondary | ICD-10-CM | POA: Diagnosis not present

## 2021-11-29 DIAGNOSIS — R110 Nausea: Secondary | ICD-10-CM

## 2021-11-29 DIAGNOSIS — A09 Infectious gastroenteritis and colitis, unspecified: Secondary | ICD-10-CM

## 2021-11-29 DIAGNOSIS — R109 Unspecified abdominal pain: Secondary | ICD-10-CM

## 2021-11-29 DIAGNOSIS — R197 Diarrhea, unspecified: Secondary | ICD-10-CM

## 2021-11-29 DIAGNOSIS — Z1231 Encounter for screening mammogram for malignant neoplasm of breast: Secondary | ICD-10-CM

## 2021-11-29 MED ORDER — ONDANSETRON HCL 4 MG PO TABS: 4.0000 mg | ORAL_TABLET | Freq: Three times a day (TID) | ORAL | 2 refills | Status: DC | PRN

## 2021-11-29 MED ORDER — AZITHROMYCIN 500 MG PO TABS: 500.0000 mg | ORAL_TABLET | Freq: Once | ORAL | 0 refills | Status: AC

## 2021-11-29 NOTE — Progress Notes (Signed)
Chief Complaint  Patient presents with   GI Problem    Pt c/o stomach issues since coming back from [place] on 6/12. Pt c/o nausea,burning,cramping & bloating. Has been taking otc prilosec w/o relief. States stomach feels raw. She has been having loose stools w/diarrhea occasionally. Denies any fever or emesis   F/u  1. She went to [facility] 11/04/21 had some spices and she and friends had nausea/diarrhea/bloating/cramping, loose stools max 3 daily watery foul odor since on and off at times constipation no fever/vomiting. She tried otc prilosec w/o relief She does not think it is ozempic as she was on this prior to going and no issues   Her goal wt is 165-170 and she has lost wt    Review of Systems  Gastrointestinal:  Positive for abdominal pain, constipation, diarrhea and nausea. Negative for vomiting.   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   GERD (gastroesophageal reflux disease)    Positive TB test    Thalassemia minor    Past Surgical History:  Procedure Laterality Date   BREAST BIOPSY Right 08/2011   negative results   CHOLECYSTECTOMY  1992   COLONOSCOPY WITH PROPOFOL N/A 05/05/2019   Procedure: COLONOSCOPY WITH PROPOFOL;  Surgeon: Virgel Manifold, MD;  Location: [facility] ENDOSCOPY;  Service: Endoscopy;  Laterality: N/A;   WISDOM TOOTH EXTRACTION     Family History  Problem Relation Age of Onset   Hypertension Maternal Grandmother    Heart disease Maternal Grandmother        heart failure   Glaucoma Maternal Grandfather    Diabetes Maternal Grandfather    Hypertension Maternal Grandfather    Hypertension Father    Stroke Father    Diabetes Mother    Glaucoma Mother    Allergic rhinitis Mother    Allergic rhinitis Brother    Colon cancer Brother    Cancer Neg Hx    Asthma Neg Hx    Eczema Neg Hx    Urticaria Neg Hx    Social History   Socioeconomic History   Marital  status: Divorced    Spouse name: Not on file   Number of children: Not on file   Years of education: Not on file   Highest education level: Not on file  Occupational History   Not on file  Tobacco Use   Smoking status: Never   Smokeless tobacco: Never  Vaping Use   Vaping Use: Never used  Substance and Sexual Activity   Alcohol use: Yes    Alcohol/week: 0.0 standard drinks of alcohol    Comment: occasional   Drug use: No   Sexual activity: Not Currently    Birth control/protection: None  Other Topics Concern   Not on file  Social History Narrative   PhD [employer] graduates 05/03/21    Social Determinants of Health   Financial Resource Strain: Not on file  Food Insecurity: Not on file  Transportation Needs: Not on file  Physical Activity: Not on file  Stress: Not on file  Social Connections: Not on file  Intimate Partner Violence: Not on file   Current Meds  Medication Sig   azithromycin (ZITHROMAX) 500 MG tablet Take 1 tablet (500 mg total) by mouth once for 1 dose. With food   Cholecalciferol (VITAMIN D) 2000 units tablet Take 2,000 Units by mouth daily.   Cholecalciferol 1.25 MG (  50000 UT) capsule Take 1 capsule (50,000 Units total) by mouth once a week. D3 formulation w/o shellfish allergic to this anaphylaxis   clobetasol (TEMOVATE) 0.05 % external solution Apply topically.   EPINEPHrine (EPIPEN 2-PAK) 0.3 mg/0.3 mL IJ SOAJ injection Inject 0.3 mg into the muscle as needed for anaphylaxis.   fluticasone (FLONASE) 50 MCG/ACT nasal spray Place 2 sprays into both nostrils daily.   ibuprofen (ADVIL,MOTRIN) 200 MG tablet Take 800 mg by mouth as needed.   Multiple Vitamin (MULTIVITAMIN) tablet Take 1 tablet by mouth daily.   omeprazole (PRILOSEC) 20 MG capsule Take 20 mg by mouth as needed.    pravastatin (PRAVACHOL) 20 MG tablet Take 1 tablet (20 mg total) by mouth daily. After 6 pm   Semaglutide,0.25 or 0.'5MG'$ /DOS, (OZEMPIC, 0.25 OR 0.5 MG/DOSE,) 2 MG/1.5ML SOPN Inject 0.5 mg  into the skin once a week.   [DISCONTINUED] ondansetron (ZOFRAN) 4 MG tablet Take 1 tablet (4 mg total) by mouth every 8 (eight) hours as needed.   Allergies  Allergen Reactions   Shellfish Allergy Anaphylaxis, Hives, Itching and Swelling   Metformin And Related     IR or ER>diarrhea    Recent Results (from the past 2160 hour(s))  Comprehensive metabolic panel     Status: None   Collection Time: 09/03/21  8:21 AM  Result Value Ref Range   Sodium 144 135 - 145 mEq/L   Potassium 5.0 3.5 - 5.1 mEq/L   Chloride 108 96 - 112 mEq/L   CO2 28 19 - 32 mEq/L   Glucose, Bld 79 70 - 99 mg/dL   BUN 13 6 - 23 mg/dL   Creatinine, Ser 0.93 0.40 - 1.20 mg/dL   Total Bilirubin 0.4 0.2 - 1.2 mg/dL   Alkaline Phosphatase 45 39 - 117 U/L   AST 21 0 - 37 U/L   ALT 21 0 - 35 U/L   Total Protein 6.7 6.0 - 8.3 g/dL   Albumin 4.3 3.5 - 5.2 g/dL   GFR 69.59 >60.00 mL/min    Comment: Calculated using the CKD-EPI Creatinine Equation (2021)   Calcium 10.0 8.4 - 10.5 mg/dL  Lipid panel     Status: Abnormal   Collection Time: 09/03/21  8:21 AM  Result Value Ref Range   Cholesterol 209 (H) 0 - 200 mg/dL    Comment: ATP III Classification       Desirable:  < 200 mg/dL               Borderline High:  200 - 239 mg/dL          High:  > = 240 mg/dL   Triglycerides 113.0 0.0 - 149.0 mg/dL    Comment: Normal:  <150 mg/dLBorderline High:  150 - 199 mg/dL   HDL 51.80 >39.00 mg/dL   VLDL 22.6 0.0 - 40.0 mg/dL   LDL Cholesterol 134 (H) 0 - 99 mg/dL   Total CHOL/HDL Ratio 4     Comment:                Men          Women1/2 Average Risk     3.4          3.3Average Risk          5.0          4.42X Average Risk          9.6          7.13X Average Risk  15.0          11.0                       NonHDL 156.95     Comment: NOTE:  Non-HDL goal should be 30 mg/dL higher than patient's LDL goal (i.e. LDL goal of < 70 mg/dL, would have non-HDL goal of < 100 mg/dL)  CBC with Differential/Platelet     Status: Abnormal    Collection Time: 09/03/21  8:21 AM  Result Value Ref Range   WBC 6.4 4.0 - 10.5 K/uL   RBC 4.88 3.87 - 5.11 Mil/uL   Hemoglobin 11.0 (L) 12.0 - 15.0 g/dL   HCT 35.0 (L) 36.0 - 46.0 %   MCV 71.8 (L) 78.0 - 100.0 fl   MCHC 31.6 30.0 - 36.0 g/dL   RDW 15.5 11.5 - 15.5 %   Platelets 242.0 150.0 - 400.0 K/uL   Neutrophils Relative % 52.0 43.0 - 77.0 %   Lymphocytes Relative 38.4 12.0 - 46.0 %   Monocytes Relative 8.3 3.0 - 12.0 %   Eosinophils Relative 0.9 0.0 - 5.0 %   Basophils Relative 0.4 0.0 - 3.0 %   Neutro Abs 3.3 1.4 - 7.7 K/uL   Lymphs Abs 2.5 0.7 - 4.0 K/uL   Monocytes Absolute 0.5 0.1 - 1.0 K/uL   Eosinophils Absolute 0.1 0.0 - 0.7 K/uL   Basophils Absolute 0.0 0.0 - 0.1 K/uL  Hemoglobin A1c     Status: None   Collection Time: 09/03/21  8:21 AM  Result Value Ref Range   Hgb A1c MFr Bld 6.5 4.6 - 6.5 %    Comment: Glycemic Control Guidelines for People with Diabetes:Non Diabetic:  <6%Goal of Therapy: <7%Additional Action Suggested:  >8%   Urinalysis, Routine w reflex microscopic     Status: Abnormal   Collection Time: 09/03/21  8:21 AM  Result Value Ref Range   Color, Urine YELLOW YELLOW   APPearance TURBID (A) CLEAR   Specific Gravity, Urine 1.022 1.001 - 1.035   pH < OR = 5.0 5.0 - 8.0   Glucose, UA NEGATIVE NEGATIVE   Bilirubin Urine NEGATIVE NEGATIVE   Ketones, ur NEGATIVE NEGATIVE   Hgb urine dipstick NEGATIVE NEGATIVE   Protein, ur NEGATIVE NEGATIVE   Nitrite NEGATIVE NEGATIVE   Leukocytes,Ua NEGATIVE NEGATIVE  Vitamin D (25 hydroxy)     Status: Abnormal   Collection Time: 09/03/21  8:21 AM  Result Value Ref Range   VITD 15.20 (L) 30.00 - 100.00 ng/mL   Objective  Body mass index is 32.2 kg/m. Wt Readings from Last 3 Encounters:  11/29/21 187 lb 9.6 oz (85.1 kg)  09/03/21 195 lb (88.5 kg)  02/06/21 198 lb 3.2 oz (89.9 kg)   Temp Readings from Last 3 Encounters:  11/29/21 98.1 F (36.7 C) (Oral)  09/03/21 97.8 F (36.6 C) (Oral)  12/04/20 98 F (36.7  C) (Oral)   BP Readings from Last 3 Encounters:  11/29/21 120/80  09/03/21 118/78  12/04/20 140/86   Pulse Readings from Last 3 Encounters:  11/29/21 (!) 52  09/03/21 61  12/04/20 62    Physical Exam Vitals and nursing note reviewed.  Constitutional:      Appearance: Normal appearance. She is well-developed and well-groomed.  HENT:     Head: Normocephalic and atraumatic.  Eyes:     Conjunctiva/sclera: Conjunctivae normal.     Pupils: Pupils are equal, round, and reactive to light.  Cardiovascular:  Rate and Rhythm: Normal rate and regular rhythm.     Heart sounds: Normal heart sounds. No murmur heard. Pulmonary:     Effort: Pulmonary effort is normal.     Breath sounds: Normal breath sounds.  Abdominal:     General: Abdomen is flat. Bowel sounds are normal.     Tenderness: There is no abdominal tenderness.  Musculoskeletal:        General: No tenderness.  Skin:    General: Skin is warm and dry.  Neurological:     General: No focal deficit present.     Mental Status: She is alert and oriented to person, place, and time. Mental status is at baseline.     Cranial Nerves: Cranial nerves 2-12 are intact.     Motor: Motor function is intact.     Coordination: Coordination is intact.     Gait: Gait is intact.  Psychiatric:        Attention and Perception: Attention and perception normal.        Mood and Affect: Mood and affect normal.        Speech: Speech normal.        Behavior: Behavior normal. Behavior is cooperative.        Thought Content: Thought content normal.        Cognition and Memory: Cognition and memory normal.        Judgment: Judgment normal.     Assessment  Plan  Abdominal bloating and diarrhea since returning from [place] ? Travelers diarrhea r/o C diff pt does not think ozempic as she was tolerating this before her trip-  Nausea - Plan: ondansetron (ZOFRAN) 4 MG tablet Abdominal cramping  Diarrhea, unspecified type - Plan: C Difficile Quick  Screen w PCR reflex r/o  Align probotics or culturelle Pre-pro biotics  Brat diet avoid dairy  Hydrate  Travelers' diarrhea  Rx azithromycin 500 mg qd x 3-5 days   HM Flu shot 02/2021 Tdap utd 10/26/14 4/4 moderna had 4th dose in 02/2021 or 03/2021  Shingrix declines and prevnar 20 in the future  [facility]  Lorita Officer PA pap ROI today saw Dr. Charlesetta Garibaldi late 10/2021 and note in from 11/27/21 but no record of pap  Mammogram 11/12/21 neg ordered for 11/13/22   Foot exam at f/u 02/2022   Colonoscopy 05/05/19 f/u in 10 years ? Mom h/o precancerous polyps or not then change to 5 years -10   Rec healthy diet and exercise    Provider: Dr. Olivia Mackie McLean-Scocuzza-Internal Medicine

## 2021-11-29 NOTE — Patient Instructions (Addendum)
Align probotics or culturelle Pre-pro biotics    Bland Diet A bland diet may consist of soft foods or foods that are not high in fat or are not greasy, acidic, or spicy. Avoiding certain foods may cause less irritation to your mouth, throat, stomach, or gastrointestinal tract. Avoiding certain foods may make you feel better. Everyone's tolerances are different. A bland diet should be based on what you can tolerate and what may cause discomfort. What is my plan? Your health care provider or dietitian may recommend specific changes to your diet to treat your symptoms. These changes may include: Eating small meals frequently. Cooking food until it is soft enough to chew easily. Taking the time to chew your food thoroughly, so it is easy to swallow and digest. Avoiding foods that cause you discomfort. These may include spicy food, fried food, greasy foods, hard-to-chew foods, or citrus fruits and juices. Drinking slowly. What are tips for following this plan? Reading food labels To reduce fiber intake, look for food labels that say "whole," such as whole wheat or whole grain. Shopping Avoid food items that may have nuts or seeds. Avoid vegetables that may make you gassy or have a tough texture, such as broccoli, cauliflower, or corn. Cooking Cook foods thoroughly so they have a soft texture. Meal planning Make sure you include foods from all food groups to eat a balanced diet. Eat a variety of types of foods. Eat foods and drink beverages that do not cause you discomfort. These may include soups and broths with cooked meats, pasta, and vegetables. Lifestyle Sit up after meals, avoid tight clothing, and take time to eat and chew your food slowly. Ask your health care provider whether you should take dietary supplements. General information Mildly season your foods. Some seasonings, such as cayenne pepper, vinegar, or hot sauce, may cause irritation. The foods, beverages, or seasonings to  avoid should be based on individual tolerance. What foods should I eat? Fruits Canned or cooked fruit such as peaches, pears, or applesauce. Bananas. Vegetables Well-cooked vegetables. Canned or cooked vegetables such as carrots, green beans, beets, or spinach. Mashed or boiled potatoes. Grains  Hot cereals, such as cream of wheat and processed oatmeal. Rice. Bread, crackers, pasta, or tortillas made from refined white flour. Meats and other proteins  Eggs. Creamy peanut butter or other nut butters. Lean, well-cooked tender meats, such as beef, pork, chicken, or fish. Dairy Low-fat dairy products such as milk, cottage cheese, or yogurt. Beverages  Water. Herbal tea. Apple juice. Fats and oils Mild salad dressings. Canola or olive oil. Sweets and desserts Low-fat pudding, custard, or ice cream. Fruit gelatin. The items listed above may not be a complete list of foods and beverages you can eat. Contact a dietitian for more information. What foods should I avoid? Fruits Citrus fruits, such as oranges and grapefruit. Fruits with a stringy texture. Fruits that have lots of seeds, such as kiwi or strawberries. Dried fruits. Vegetables Raw, uncooked vegetables. Salads. Grains Whole grain breads, muffins, and cereals. Meats and other proteins Tough, fibrous meats. Highly seasoned meat such as corned beef, smoked meats, or fish. Processed high-fat meats such as brats, hot dogs, or sausage. Dairy Full-fat dairy foods such as ice cream and cheese. Beverages Caffeinated drinks. Alcohol. Seasonings and condiments Strongly flavored seasonings or condiments. Hot sauce. Salsa. Other foods Spicy foods. Fried or greasy foods. Sour foods, such as pickled or fermented foods like sauerkraut. Foods high in fiber. The items listed above may not be  a complete list of foods and beverages you should avoid. Contact a dietitian for more information. Summary A bland diet should be based on individual  tolerance. It may consist of foods that are soft textured and do not have a lot of fat, fiber, acid, or seasonings. A bland diet may be recommended because avoiding certain foods, beverages, or spices may make you feel better. This information is not intended to replace advice given to you by your health care provider. Make sure you discuss any questions you have with your health care provider. Document Revised: 04/01/2021 Document Reviewed: 04/01/2021 Elsevier Patient Education  [place].  Diarrhea, Adult Diarrhea is frequent loose and watery bowel movements. Diarrhea can make you feel weak and cause you to become dehydrated. Dehydration can make you tired and thirsty, cause you to have a dry mouth, and decrease how often you urinate. Diarrhea typically lasts 2-3 days. However, it can last longer if it is a sign of something more serious. It is important to treat your diarrhea as told by your health care provider. Follow these instructions at home: Eating and drinking     Follow these recommendations as told by your health care provider: Take an oral rehydration solution (ORS). This is an over-the-counter medicine that helps return your body to its normal balance of nutrients and water. It is found at pharmacies and retail stores. Drink plenty of fluids, such as water, ice chips, diluted fruit juice, and low-calorie sports drinks. You can drink milk also, if desired. Avoid drinking fluids that contain a lot of sugar or caffeine, such as energy drinks, sports drinks, and soda. Eat bland, easy-to-digest foods in small amounts as you are able. These foods include bananas, applesauce, rice, lean meats, toast, and crackers. Avoid alcohol. Avoid spicy or fatty foods.  Medicines Take over-the-counter and prescription medicines only as told by your health care provider. If you were prescribed an antibiotic medicine, take it as told by your health care provider. Do not stop using the  antibiotic even if you start to feel better. General instructions  Wash your hands often using soap and water. If soap and water are not available, use a hand sanitizer. Others in the household should wash their hands as well. Hands should be washed: After using the toilet or changing a diaper. Before preparing, cooking, or serving food. While caring for a sick person or while visiting someone in a hospital. Drink enough fluid to keep your urine pale yellow. Rest at home while you recover. Watch your condition for any changes. Take a warm bath to relieve any burning or pain from frequent diarrhea episodes. Keep all follow-up visits as told by your health care provider. This is important. Contact a health care provider if: You have a fever. Your diarrhea gets worse. You have new symptoms. You cannot keep fluids down. You feel light-headed or dizzy. You have a headache. You have muscle cramps. Get help right away if: You have chest pain. You feel extremely weak or you faint. You have bloody or black stools or stools that look like tar. You have severe pain, cramping, or bloating in your abdomen. You have trouble breathing or you are breathing very quickly. Your heart is beating very quickly. Your skin feels cold and clammy. You feel confused. You have signs of dehydration, such as: Dark urine, very little urine, or no urine. Cracked lips. Dry mouth. Sunken eyes. Sleepiness. Weakness. Summary Diarrhea is frequent loose and sometimes watery bowel movements. Diarrhea can  make you feel weak and cause you to become dehydrated. Drink enough fluids to keep your urine pale yellow. Make sure that you wash your hands after using the toilet. If soap and water are not available, use hand sanitizer. Contact a health care provider if your diarrhea gets worse or you have new symptoms. Get help right away if you have signs of dehydration. This information is not intended to replace advice  given to you by your health care provider. Make sure you discuss any questions you have with your health care provider. Document Revised: 11/21/2020 Document Reviewed: 11/21/2020 Elsevier Patient Education  [place].

## 2021-12-05 ENCOUNTER — Telehealth: Payer: Self-pay

## 2021-12-05 NOTE — Telephone Encounter (Signed)
Initiated PA via covermymeds on 12/05/21 for pt's Semaglutide,0.25 or 0.'5MG'$ /DOS, (OZEMPIC, 0.25 OR 0.5 MG/DOSE,) 2 MG/1.5ML SOPN Administered through [place] [id number]  Key: [id number]  Awaiting denial or approval

## 2021-12-23 ENCOUNTER — Other Ambulatory Visit: Payer: Self-pay | Admitting: Internal Medicine

## 2021-12-23 DIAGNOSIS — E119 Type 2 diabetes mellitus without complications: Secondary | ICD-10-CM

## 2021-12-23 MED ORDER — SEMAGLUTIDE(0.25 OR 0.5MG/DOS) 2 MG/3ML ~~LOC~~ SOPN: 0.5000 mg | PEN_INJECTOR | SUBCUTANEOUS | 11 refills | Status: DC

## 2021-12-30 ENCOUNTER — Encounter: Payer: Self-pay | Admitting: Internal Medicine

## 2022-01-09 ENCOUNTER — Telehealth: Payer: Self-pay

## 2022-01-09 NOTE — Telephone Encounter (Signed)
Started PA on today 8-17 waiting approval or denial   Sara Mercer (Key: [id number]) Rx #: [id number] Ozempic (0.25 or 0.5 MG/DOSE) '2MG'$ /3ML pen-injectors

## 2022-01-13 ENCOUNTER — Telehealth: Payer: Self-pay

## 2022-01-13 NOTE — Telephone Encounter (Signed)
Prior Authorization has been started today 01-13-22 it is awaiting approval or denial:    Sara Mercer (Key: [id number]) Ozempic (0.25 or 0.5 MG/DOSE) '2MG'$ /1.5ML pen-injectors   Form [employer] control surveyor Form (CB)

## 2022-01-20 NOTE — Telephone Encounter (Signed)
PA has been cancelled I called the insurance to try and get it done over the phone:  Ref: Jonelle Sidle) PA# [id number]   Spoke with Tiffany and she stated that we would not be able to do this over the phone that they will fax a form that has to be filled out due to the type of medication we will have to fax OV notes and med lists as well. Tiffany also stated that this PA will be closed as well if we don't respond within 3 days.

## 2022-01-29 NOTE — Telephone Encounter (Signed)
Received a notice of approval for pts Ozempic on today 01/29/22  Medication: Ozempic  Coverage period: 01-28-22 thru 01-27-25  [facility] about the approval told pt to call back if she had any questions or concerns.

## 2022-02-28 LAB — HM DIABETES EYE EXAM

## 2022-03-05 ENCOUNTER — Ambulatory Visit: Payer: BC Managed Care – PPO | Admitting: Internal Medicine

## 2022-03-05 ENCOUNTER — Telehealth: Payer: Self-pay

## 2022-03-05 ENCOUNTER — Encounter: Payer: Self-pay | Admitting: Internal Medicine

## 2022-03-05 VITALS — BP 118/68 | HR 66 | Temp 98.1°F | Ht 64.0 in | Wt 187.6 lb

## 2022-03-05 DIAGNOSIS — Z23 Encounter for immunization: Secondary | ICD-10-CM

## 2022-03-05 DIAGNOSIS — Z6833 Body mass index (BMI) 33.0-33.9, adult: Secondary | ICD-10-CM | POA: Diagnosis not present

## 2022-03-05 DIAGNOSIS — E538 Deficiency of other specified B group vitamins: Secondary | ICD-10-CM

## 2022-03-05 DIAGNOSIS — J302 Other seasonal allergic rhinitis: Secondary | ICD-10-CM

## 2022-03-05 DIAGNOSIS — R110 Nausea: Secondary | ICD-10-CM

## 2022-03-05 DIAGNOSIS — E119 Type 2 diabetes mellitus without complications: Secondary | ICD-10-CM

## 2022-03-05 DIAGNOSIS — E559 Vitamin D deficiency, unspecified: Secondary | ICD-10-CM

## 2022-03-05 DIAGNOSIS — E782 Mixed hyperlipidemia: Secondary | ICD-10-CM

## 2022-03-05 DIAGNOSIS — R748 Abnormal levels of other serum enzymes: Secondary | ICD-10-CM

## 2022-03-05 DIAGNOSIS — D509 Iron deficiency anemia, unspecified: Secondary | ICD-10-CM

## 2022-03-05 DIAGNOSIS — E785 Hyperlipidemia, unspecified: Secondary | ICD-10-CM

## 2022-03-05 DIAGNOSIS — Z1329 Encounter for screening for other suspected endocrine disorder: Secondary | ICD-10-CM

## 2022-03-05 LAB — CBC WITH DIFFERENTIAL/PLATELET
Basophils Absolute: 0 10*3/uL (ref 0.0–0.1)
Basophils Relative: 0.2 % (ref 0.0–3.0)
Eosinophils Absolute: 0.1 10*3/uL (ref 0.0–0.7)
Eosinophils Relative: 0.8 % (ref 0.0–5.0)
HCT: 34.9 % — ABNORMAL LOW (ref 36.0–46.0)
Hemoglobin: 10.9 g/dL — ABNORMAL LOW (ref 12.0–15.0)
Lymphocytes Relative: 33.7 % (ref 12.0–46.0)
Lymphs Abs: 2.2 10*3/uL (ref 0.7–4.0)
MCHC: 31.2 g/dL (ref 30.0–36.0)
MCV: 71.8 fl — ABNORMAL LOW (ref 78.0–100.0)
Monocytes Absolute: 0.7 10*3/uL (ref 0.1–1.0)
Monocytes Relative: 10.4 % (ref 3.0–12.0)
Neutro Abs: 3.5 10*3/uL (ref 1.4–7.7)
Neutrophils Relative %: 54.9 % (ref 43.0–77.0)
Platelets: 244 10*3/uL (ref 150.0–400.0)
RBC: 4.86 Mil/uL (ref 3.87–5.11)
RDW: 15.7 % — ABNORMAL HIGH (ref 11.5–15.5)
WBC: 6.4 10*3/uL (ref 4.0–10.5)

## 2022-03-05 LAB — COMPREHENSIVE METABOLIC PANEL
ALT: 37 U/L — ABNORMAL HIGH (ref 0–35)
AST: 31 U/L (ref 0–37)
Albumin: 4.2 g/dL (ref 3.5–5.2)
Alkaline Phosphatase: 51 U/L (ref 39–117)
BUN: 11 mg/dL (ref 6–23)
CO2: 27 mEq/L (ref 19–32)
Calcium: 9.5 mg/dL (ref 8.4–10.5)
Chloride: 107 mEq/L (ref 96–112)
Creatinine, Ser: 0.8 mg/dL (ref 0.40–1.20)
GFR: 83.08 mL/min (ref >60.00)
Glucose, Bld: 88 mg/dL (ref 70–99)
Potassium: 4 mEq/L (ref 3.5–5.1)
Sodium: 141 mEq/L (ref 135–145)
Total Bilirubin: 0.3 mg/dL (ref 0.2–1.2)
Total Protein: 6.9 g/dL (ref 6.0–8.3)

## 2022-03-05 LAB — LIPID PANEL
Cholesterol: 158 mg/dL (ref 0–200)
HDL: 44.8 mg/dL (ref >39.00)
LDL Cholesterol: 76 mg/dL (ref 0–99)
NonHDL: 112.86
Total CHOL/HDL Ratio: 4
Triglycerides: 185 mg/dL — ABNORMAL HIGH (ref 0.0–149.0)
VLDL: 37 mg/dL (ref 0.0–40.0)

## 2022-03-05 LAB — MICROALBUMIN / CREATININE URINE RATIO
Creatinine,U: 201.6 mg/dL
Microalb Creat Ratio: 0.6 mg/g (ref 0.0–30.0)
Microalb, Ur: 1.3 mg/dL (ref 0.0–1.9)

## 2022-03-05 LAB — VITAMIN B12: Vitamin B-12: 176 pg/mL — ABNORMAL LOW (ref 211–911)

## 2022-03-05 LAB — HEMOGLOBIN A1C: Hgb A1c MFr Bld: 6.2 % (ref 4.6–6.5)

## 2022-03-05 LAB — VITAMIN D 25 HYDROXY (VIT D DEFICIENCY, FRACTURES): VITD: 45.48 ng/mL (ref 30.00–100.00)

## 2022-03-05 LAB — TSH: TSH: 1.72 u[IU]/mL (ref 0.35–5.50)

## 2022-03-05 MED ORDER — SEMAGLUTIDE(0.25 OR 0.5MG/DOS) 2 MG/3ML ~~LOC~~ SOPN: 0.5000 mg | PEN_INJECTOR | SUBCUTANEOUS | 11 refills | Status: DC

## 2022-03-05 MED ORDER — CLOBETASOL PROPIONATE 0.05 % EX SOLN: Freq: Two times a day (BID) | CUTANEOUS | 11 refills | Status: DC

## 2022-03-05 MED ORDER — ONDANSETRON HCL 4 MG PO TABS: 4.0000 mg | ORAL_TABLET | Freq: Three times a day (TID) | ORAL | 11 refills | Status: DC | PRN

## 2022-03-05 MED ORDER — OMEPRAZOLE 20 MG PO CPDR: 20.0000 mg | DELAYED_RELEASE_CAPSULE | ORAL | 3 refills | Status: DC | PRN

## 2022-03-05 MED ORDER — EPINEPHRINE 0.3 MG/0.3ML IJ SOAJ: 0.3000 mg | INTRAMUSCULAR | 2 refills | Status: DC | PRN

## 2022-03-05 MED ORDER — CHOLECALCIFEROL 1.25 MG (50000 UT) PO CAPS: 50000.0000 [IU] | ORAL_CAPSULE | ORAL | 1 refills | Status: DC

## 2022-03-05 MED ORDER — PRAVASTATIN SODIUM 20 MG PO TABS: 20.0000 mg | ORAL_TABLET | Freq: Every day | ORAL | 3 refills | Status: DC

## 2022-03-05 MED ORDER — FLUTICASONE PROPIONATE 50 MCG/ACT NA SUSP: 2.0000 | Freq: Every day | NASAL | 11 refills | Status: DC

## 2022-03-05 NOTE — Telephone Encounter (Signed)
Patient states she just saw Dr. Olivia Mackie McLean-Scocuzza and forgot to request proof of her flu vaccine.  Patient states she would like for Sara Mercer to please send this to her MyChart.

## 2022-03-05 NOTE — Progress Notes (Addendum)
Chief Complaint  Patient presents with   Follow-up    6 month f/u   F/u  1. Dm 2 A1c controlled on ozempic 0.5 weekly statin pravachol 20 mg qhs     Review of Systems  Constitutional:  Negative for weight loss.  HENT:  Negative for hearing loss.   Eyes:  Negative for blurred vision.  Respiratory:  Negative for shortness of breath.   Cardiovascular:  Negative for chest pain.  Gastrointestinal:  Negative for abdominal pain and blood in stool.  Genitourinary:  Negative for dysuria.  Musculoskeletal:  Negative for falls and joint pain.  Skin:  Negative for rash.  Neurological:  Negative for headaches.  Psychiatric/Behavioral:  Negative for depression.    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   GERD (gastroesophageal reflux disease)    Positive TB test    Thalassemia minor    Past Surgical History:  Procedure Laterality Date   BREAST BIOPSY Right 08/2011   negative results   CHOLECYSTECTOMY  1992   COLONOSCOPY WITH PROPOFOL N/A 05/05/2019   Procedure: COLONOSCOPY WITH PROPOFOL;  Surgeon: Virgel Manifold, MD;  Location: [facility] ENDOSCOPY;  Service: Endoscopy;  Laterality: N/A;   WISDOM TOOTH EXTRACTION     Family History  Problem Relation Age of Onset   Hypertension Maternal Grandmother    Heart disease Maternal Grandmother        heart failure   Glaucoma Maternal Grandfather    Diabetes Maternal Grandfather    Hypertension Maternal Grandfather    Hypertension Father    Stroke Father    Diabetes Mother    Glaucoma Mother    Allergic rhinitis Mother    Allergic rhinitis Brother    Colon cancer Brother    Cancer Neg Hx    Asthma Neg Hx    Eczema Neg Hx    Urticaria Neg Hx    Social History   Socioeconomic History   Marital status: Divorced    Spouse name: Not on file   Number of children: Not on file   Years of education: Not on file   Highest education level: Not on  file  Occupational History   Not on file  Tobacco Use   Smoking status: Never   Smokeless tobacco: Never  Vaping Use   Vaping Use: Never used  Substance and Sexual Activity   Alcohol use: Yes    Alcohol/week: 0.0 standard drinks of alcohol    Comment: occasional   Drug use: No   Sexual activity: Not Currently    Birth control/protection: None  Other Topics Concern   Not on file  Social History Narrative   PhD [employer] graduates 05/03/21    Social Determinants of Health   Financial Resource Strain: Not on file  Food Insecurity: Not on file  Transportation Needs: Not on file  Physical Activity: Not on file  Stress: Not on file  Social Connections: Not on file  Intimate Partner Violence: Not on file   Current Meds  Medication Sig   Cholecalciferol (VITAMIN D) 2000 units tablet Take 2,000 Units by mouth daily.   ibuprofen (ADVIL,MOTRIN) 200 MG tablet Take 800 mg by mouth as needed.   Multiple Vitamin (MULTIVITAMIN) tablet Take 1 tablet by mouth daily.   [DISCONTINUED] Cholecalciferol 1.25 MG (50000 UT) capsule Take 1 capsule (50,000 Units total) by mouth once a week. D3 formulation  w/o shellfish allergic to this anaphylaxis   [DISCONTINUED] clobetasol (TEMOVATE) 0.05 % external solution Apply topically.   [DISCONTINUED] EPINEPHrine (EPIPEN 2-PAK) 0.3 mg/0.3 mL IJ SOAJ injection Inject 0.3 mg into the muscle as needed for anaphylaxis.   [DISCONTINUED] fluticasone (FLONASE) 50 MCG/ACT nasal spray Place 2 sprays into both nostrils daily.   [DISCONTINUED] omeprazole (PRILOSEC) 20 MG capsule Take 20 mg by mouth as needed.    [DISCONTINUED] pravastatin (PRAVACHOL) 20 MG tablet Take 1 tablet (20 mg total) by mouth daily. After 6 pm   [DISCONTINUED] Semaglutide,0.25 or 0.'5MG'$ /DOS, 2 MG/3ML SOPN Inject 0.5 mg into the skin once a week.   Allergies  Allergen Reactions   Shellfish Allergy Anaphylaxis, Hives, Itching and Swelling   Metformin And Related     IR or ER>diarrhea    No results  found for this or any previous visit (from the past 2160 hour(s)). Objective  Body mass index is 32.2 kg/m. Wt Readings from Last 3 Encounters:  03/05/22 187 lb 9.6 oz (85.1 kg)  11/29/21 187 lb 9.6 oz (85.1 kg)  09/03/21 195 lb (88.5 kg)   Temp Readings from Last 3 Encounters:  03/05/22 98.1 F (36.7 C) (Oral)  11/29/21 98.1 F (36.7 C) (Oral)  09/03/21 97.8 F (36.6 C) (Oral)   BP Readings from Last 3 Encounters:  03/05/22 118/68  11/29/21 120/80  09/03/21 118/78   Pulse Readings from Last 3 Encounters:  03/05/22 66  11/29/21 (!) 52  09/03/21 61    Physical Exam Vitals and nursing note reviewed.  Constitutional:      Appearance: Normal appearance. She is well-developed and well-groomed.  HENT:     Head: Normocephalic and atraumatic.  Eyes:     Conjunctiva/sclera: Conjunctivae normal.     Pupils: Pupils are equal, round, and reactive to light.  Cardiovascular:     Rate and Rhythm: Normal rate and regular rhythm.     Heart sounds: Normal heart sounds. No murmur heard. Pulmonary:     Effort: Pulmonary effort is normal.     Breath sounds: Normal breath sounds.  Abdominal:     General: Abdomen is flat. Bowel sounds are normal.     Tenderness: There is no abdominal tenderness.  Musculoskeletal:        General: No tenderness.  Skin:    General: Skin is warm and dry.  Neurological:     General: No focal deficit present.     Mental Status: She is alert and oriented to person, place, and time. Mental status is at baseline.     Cranial Nerves: Cranial nerves 2-12 are intact.     Motor: Motor function is intact.     Coordination: Coordination is intact.     Gait: Gait is intact.  Psychiatric:        Attention and Perception: Attention and perception normal.        Mood and Affect: Mood and affect normal.        Speech: Speech normal.        Behavior: Behavior normal. Behavior is cooperative.        Thought Content: Thought content normal.        Cognition and  Memory: Cognition and memory normal.        Judgment: Judgment normal.     Assessment  Plan  Type 2 diabetes mellitus without complication, without long-term current use of insulin (HCC) - Plan: Comprehensive metabolic panel, Lipid panel, Hemoglobin A1c, CBC with Differential/Platelet, Microalbumin / creatinine urine ratio, Semaglutide,0.25  or 0.'5MG'$ /DOS, 2 MG/3ML SOPN  Nausea - Plan: ondansetron (ZOFRAN) 4 MG tablet prn ozempic  Hyperlipidemia, mixed and for dm guidelines- Plan: pravastatin (PRAVACHOL) 20 MG tablet  Seasonal allergies - Plan: fluticasone (FLONASE) 50 MCG/ACT nasal spray   HM Flu shot given today Tdap utd 10/26/14 Consider prevnar 20  4/4 moderna had 4th dose in 02/2021 or 03/2021  Shingrix declines and prevnar 20 in the future   [facility]  Lorita Officer PA pap ROI today saw Dr. Charlesetta Garibaldi  Pap negative 09/03/20 repeat in 3 years   Mammogram 11/12/21 neg ordered for 11/13/22    Foot exam normal today    Colonoscopy 05/05/19 f/u in 10 years ? Mom h/o precancerous polyps or not then change to 5 years -10  Colonoscopy in 04/2024 with moms history   Rec healthy diet and exercise  Provider: Dr. Olivia Mackie McLean-Scocuzza-Internal Medicine

## 2022-03-05 NOTE — Telephone Encounter (Signed)
Immunization record has been mailed to pt and pt has been notified.

## 2022-03-05 NOTE — Patient Instructions (Addendum)
Also consider prevnar 20 vaccine Colonoscopy due 04/2024   Pneumococcal Conjugate Vaccine (Prevnar 20) Suspension for Injection What is this medication? PNEUMOCOCCAL VACCINE (NEU mo KOK al vak SEEN) is a vaccine. It prevents pneumococcus bacterial infections. These bacteria can cause serious infections like pneumonia, meningitis, and blood infections. This vaccine will not treat an infection and will not cause infection. This vaccine is recommended for adults 18 years and older. This medicine may be used for other purposes; ask your health care provider or pharmacist if you have questions. COMMON BRAND NAME(S): Prevnar 20 What should I tell my care team before I take this medication? They need to know if you have any of these conditions: bleeding disorder fever immune system problems an unusual or allergic reaction to pneumococcal vaccine, diphtheria toxoid, other vaccines, other medicines, foods, dyes, or preservatives pregnant or trying to get pregnant breast-feeding How should I use this medication? This vaccine is injected into a muscle. It is given by a health care provider. A copy of Vaccine Information Statements will be given before each vaccination. Be sure to read this information carefully each time. This sheet may change often. Talk to your health care provider about the use of this medicine in children. Special care may be needed. Overdosage: If you think you have taken too much of this medicine contact a poison control center or emergency room at once. NOTE: This medicine is only for you. Do not share this medicine with others. What if I miss a dose? This does not apply. This medicine is not for regular use. What may interact with this medication? medicines for cancer chemotherapy medicines that suppress your immune function steroid medicines like prednisone or cortisone This list may not describe all possible interactions. Give your health care provider a list of all the  medicines, herbs, non-prescription drugs, or dietary supplements you use. Also tell them if you smoke, drink alcohol, or use illegal drugs. Some items may interact with your medicine. What should I watch for while using this medication? Mild fever and pain should go away in 3 days or less. Report any unusual symptoms to your health care provider. What side effects may I notice from receiving this medication? Side effects that you should report to your doctor or health care professional as soon as possible: allergic reactions (skin rash, itching or hives; swelling of the face, lips, or tongue) confusion fast, irregular heartbeat fever over 102 degrees F muscle weakness seizures trouble breathing unusual bruising or bleeding Side effects that usually do not require medical attention (report to your doctor or health care professional if they continue or are bothersome): fever of 102 degrees F or less headache joint pain muscle cramps, pain pain, tender at site where injected This list may not describe all possible side effects. Call your doctor for medical advice about side effects. You may report side effects to FDA at 1-800-FDA-1088. Where should I keep my medication? This vaccine is only given by a health care provider. It will not be stored at home. NOTE: This sheet is a summary. It may not cover all possible information. If you have questions about this medicine, talk to your doctor, pharmacist, or health care provider.  2023 Elsevier/Gold Standard (2020-01-13 00:00:00)

## 2022-03-07 ENCOUNTER — Telehealth: Payer: Self-pay

## 2022-03-07 NOTE — Telephone Encounter (Signed)
LMOM for pt to CB in regards to labs 

## 2022-03-07 NOTE — Addendum Note (Signed)
Addended by: Orland Mustard on: 03/07/2022 02:36 PM   Modules accepted: Orders

## 2022-03-07 NOTE — Addendum Note (Signed)
Addended by: Orland Mustard on: 03/07/2022 02:35 PM   Modules accepted: Orders

## 2022-03-10 NOTE — Telephone Encounter (Signed)
Likely GI will order labs when they see her.  I have not seen her and would prefer to see her prior to ordering labs

## 2022-08-20 ENCOUNTER — Encounter: Payer: Self-pay | Admitting: Family Medicine

## 2022-08-21 ENCOUNTER — Other Ambulatory Visit: Payer: Self-pay | Admitting: Family

## 2022-08-21 DIAGNOSIS — S93431A Sprain of tibiofibular ligament of right ankle, initial encounter: Secondary | ICD-10-CM | POA: Insufficient documentation

## 2022-08-21 DIAGNOSIS — S82851S Displaced trimalleolar fracture of right lower leg, sequela: Secondary | ICD-10-CM

## 2022-08-25 NOTE — Telephone Encounter (Signed)
I called and spoke with the patient and she stated she would call me back about this shortly.  Kaitlen Redford,cma

## 2022-09-10 ENCOUNTER — Ambulatory Visit: Payer: BC Managed Care – PPO | Admitting: Family Medicine

## 2022-09-10 ENCOUNTER — Encounter: Payer: Self-pay | Admitting: Family Medicine

## 2022-09-10 ENCOUNTER — Other Ambulatory Visit: Payer: Self-pay | Admitting: Family Medicine

## 2022-09-10 VITALS — BP 118/68 | HR 58 | Temp 98.4°F | Resp 16 | Ht 64.0 in

## 2022-09-10 DIAGNOSIS — E118 Type 2 diabetes mellitus with unspecified complications: Secondary | ICD-10-CM | POA: Diagnosis not present

## 2022-09-10 DIAGNOSIS — D509 Iron deficiency anemia, unspecified: Secondary | ICD-10-CM

## 2022-09-10 DIAGNOSIS — E1169 Type 2 diabetes mellitus with other specified complication: Secondary | ICD-10-CM

## 2022-09-10 DIAGNOSIS — Z114 Encounter for screening for human immunodeficiency virus [HIV]: Secondary | ICD-10-CM | POA: Diagnosis not present

## 2022-09-10 DIAGNOSIS — R7989 Other specified abnormal findings of blood chemistry: Secondary | ICD-10-CM

## 2022-09-10 DIAGNOSIS — Z1231 Encounter for screening mammogram for malignant neoplasm of breast: Secondary | ICD-10-CM | POA: Diagnosis not present

## 2022-09-10 DIAGNOSIS — Z7985 Long-term (current) use of injectable non-insulin antidiabetic drugs: Secondary | ICD-10-CM | POA: Diagnosis not present

## 2022-09-10 DIAGNOSIS — Z1159 Encounter for screening for other viral diseases: Secondary | ICD-10-CM | POA: Diagnosis not present

## 2022-09-10 DIAGNOSIS — E785 Hyperlipidemia, unspecified: Secondary | ICD-10-CM | POA: Diagnosis not present

## 2022-09-10 DIAGNOSIS — E119 Type 2 diabetes mellitus without complications: Secondary | ICD-10-CM

## 2022-09-10 LAB — CBC WITH DIFFERENTIAL/PLATELET
Basophils Absolute: 0 10*3/uL (ref 0.0–0.1)
Basophils Relative: 0.1 % (ref 0.0–3.0)
Eosinophils Absolute: 0.1 10*3/uL (ref 0.0–0.7)
Eosinophils Relative: 1.2 % (ref 0.0–5.0)
HCT: 33 % — ABNORMAL LOW (ref 36.0–46.0)
Hemoglobin: 10.5 g/dL — ABNORMAL LOW (ref 12.0–15.0)
Lymphocytes Relative: 39 % (ref 12.0–46.0)
Lymphs Abs: 2.5 10*3/uL (ref 0.7–4.0)
MCHC: 31.7 g/dL (ref 30.0–36.0)
MCV: 70.8 fl — ABNORMAL LOW (ref 78.0–100.0)
Monocytes Absolute: 0.6 10*3/uL (ref 0.1–1.0)
Monocytes Relative: 9 % (ref 3.0–12.0)
Neutro Abs: 3.2 10*3/uL (ref 1.4–7.7)
Neutrophils Relative %: 50.7 % (ref 43.0–77.0)
Platelets: 343 10*3/uL (ref 150.0–400.0)
RBC: 4.66 Mil/uL (ref 3.87–5.11)
RDW: 16.2 % — ABNORMAL HIGH (ref 11.5–15.5)
WBC: 6.4 10*3/uL (ref 4.0–10.5)

## 2022-09-10 LAB — COMPREHENSIVE METABOLIC PANEL
ALT: 31 U/L (ref 0–35)
AST: 25 U/L (ref 0–37)
Albumin: 4 g/dL (ref 3.5–5.2)
Alkaline Phosphatase: 48 U/L (ref 39–117)
BUN: 14 mg/dL (ref 6–23)
CO2: 24 mEq/L (ref 19–32)
Calcium: 9.3 mg/dL (ref 8.4–10.5)
Chloride: 107 mEq/L (ref 96–112)
Creatinine, Ser: 0.75 mg/dL (ref 0.40–1.20)
GFR: 89.44 mL/min (ref >60.00)
Glucose, Bld: 76 mg/dL (ref 70–99)
Potassium: 4.2 mEq/L (ref 3.5–5.1)
Sodium: 141 mEq/L (ref 135–145)
Total Bilirubin: 0.3 mg/dL (ref 0.2–1.2)
Total Protein: 6.4 g/dL (ref 6.0–8.3)

## 2022-09-10 LAB — LIPID PANEL
Cholesterol: 156 mg/dL (ref 0–200)
HDL: 51.3 mg/dL (ref >39.00)
LDL Cholesterol: 85 mg/dL (ref 0–99)
NonHDL: 104.44
Total CHOL/HDL Ratio: 3
Triglycerides: 99 mg/dL (ref 0.0–149.0)
VLDL: 19.8 mg/dL (ref 0.0–40.0)

## 2022-09-10 LAB — TSH: TSH: 1.51 u[IU]/mL (ref 0.35–5.50)

## 2022-09-10 LAB — HEMOGLOBIN A1C: Hgb A1c MFr Bld: 5.9 % (ref 4.6–6.5)

## 2022-09-10 LAB — VITAMIN D 25 HYDROXY (VIT D DEFICIENCY, FRACTURES): VITD: 22.36 ng/mL — ABNORMAL LOW (ref 30.00–100.00)

## 2022-09-10 LAB — VITAMIN B12: Vitamin B-12: 1050 pg/mL — ABNORMAL HIGH (ref 211–911)

## 2022-09-10 MED ORDER — SEMAGLUTIDE(0.25 OR 0.5MG/DOS) 2 MG/3ML ~~LOC~~ SOPN
0.5000 mg | PEN_INJECTOR | SUBCUTANEOUS | 11 refills | Status: AC
Start: 2022-09-10 — End: 2022-11-12

## 2022-09-10 MED ORDER — PRAVASTATIN SODIUM 20 MG PO TABS: 20.0000 mg | ORAL_TABLET | Freq: Every day | ORAL | 3 refills | Status: DC

## 2022-09-10 MED ORDER — VITAMIN D (ERGOCALCIFEROL) 1.25 MG (50000 UNIT) PO CAPS
50000.0000 [IU] | ORAL_CAPSULE | ORAL | 2 refills | Status: DC
Start: 2022-09-10 — End: 2023-03-17

## 2022-09-10 NOTE — Progress Notes (Unsigned)
SUBJECTIVE:   Chief Complaint  Patient presents with   Transfer of Care   HPI Presents to clinic to transfer care.  No acute concerns today.  Type 2 diabetes Asymptomatic.  Currently on semaglutide 0.5 mg weekly.  Tolerating medication well.  Requesting refills.  Recent A1c 5.9.  Previously tried metformin but did not tolerate secondary to side effects.  On statin therapy.  Not currently on ACEi/ARB.  GERD Takes OTC Prilosec 20 mg as needed and well-controlled.  Hyperlipidemia On Pravachol 20 mg daily.  Tolerating medication well.  Denies any muscle aches or pains.  Requesting refill of medication.   PERTINENT PMH / PSH: DM type II Beta thalassemia minor Hyperlipidemia   OBJECTIVE:  BP 118/68   Pulse (!) 58   Temp 98.4 F (36.9 C)   Resp 16   Ht  (1.626 m)   LMP  (LMP Unknown)   SpO2 98%   BMI 32.20 kg/m    Physical Exam Vitals reviewed.  Constitutional:      General: She is not in acute distress.    Appearance: She is not ill-appearing.  HENT:     Head: Normocephalic.     Nose: Nose normal.  Eyes:     Conjunctiva/sclera: Conjunctivae normal.  Neck:     Thyroid: No thyromegaly or thyroid tenderness.  Cardiovascular:     Rate and Rhythm: Normal rate and regular rhythm.     Heart sounds: Normal heart sounds.  Pulmonary:     Effort: Pulmonary effort is normal.     Breath sounds: Normal breath sounds.  Abdominal:     General: Abdomen is flat. Bowel sounds are normal.     Palpations: Abdomen is soft.  Musculoskeletal:        General: Normal range of motion.     Cervical back: Normal range of motion.  Neurological:     Mental Status: She is alert and oriented to person, place, and time. Mental status is at baseline.  Psychiatric:        Mood and Affect: Mood normal.        Behavior: Behavior normal.        Thought Content: Thought content normal.        Judgment: Judgment normal.     ASSESSMENT/PLAN:  Controlled type 2 diabetes mellitus  with complication, without long-term current use of insulin Assessment & Plan: Chronic.  Stable.  Recent A1c 5.9. Tolerating GLP-1 weekly. Refill Ozempic 0.5 mg weekly Continue statin Blood pressure well-controlled. Recommend annual diabetic eye exam Recommend annual foot exam Repeat A1c in 6 months.    Orders: -     Hemoglobin A1c -     Comprehensive metabolic panel -     TSH -     Vitamin B12 -     VITAMIN D 25 Hydroxy (Vit-D Deficiency, Fractures) -     Semaglutide(0.25 or 0.5MG /DOS); Inject 0.5 mg into the skin once a week. Release 90 day supply  Dispense: 9 mL; Refill: 11  Breast cancer screening by mammogram -     3D Screening Mammogram, Left and Right; Future  Encounter for screening for HIV -     HIV Antibody (routine testing w rflx)  Encounter for hepatitis C screening test for low risk patient -     Hepatitis C antibody  Hyperlipidemia associated with type 2 diabetes mellitus Assessment & Plan: Chronic.  Recent LDL at goal On statin therapy and tolerating well.  Denies any myalgias. Refill Pravachol 20 mg  daily Check fasting lipids  Orders: -     Lipid panel -     Pravastatin Sodium; Take 1 tablet (20 mg total) by mouth daily. After 6 pm  Dispense: 90 tablet; Refill: 3  Iron deficiency anemia, unspecified iron deficiency anemia type -     CBC with Differential/Platelet -     Iron, TIBC and Ferritin Panel  HCM Colonoscopy up-to-date.  Due 2030. Mammogram up-to-date 2 7/24 Tdap up-to-date.  Due 6/26 Pap smear up-to-date.  Due 04/25.  Follows with OB/GYN. Declined shingles vaccine Recommend pneumonia 20 vaccine  PDMP reviewed  Return in about 6 months (around 03/12/2023) for PCP.  Dana Allan, MD

## 2022-09-10 NOTE — Patient Instructions (Addendum)
It was a pleasure meeting you today. Thank you for allowing me to take part in your health care.  Our goals for today as we discussed include:  Referral sent for Mammogram.  Due June 20/24  We will get some labs today.  If they are abnormal or we need to do something about them, I will call you.  If they are normal, I will send you a message on MyChart (if it is active) or a letter in the mail.  If you don't hear from [place] in 2 weeks, please call the office at the number below.   Please find out parents GI history of colon polyps to determine if needing earlier colon cancer screening   If you have any questions or concerns, please do not hesitate to call the office at [phone].  I look forward to our next visit and until then take care and stay safe.  Regards,   Dana Allan, MD   [facility]

## 2022-09-11 LAB — IRON,TIBC AND FERRITIN PANEL
%SAT: 15 % (calc) — ABNORMAL LOW (ref 16–45)
Ferritin: 112 ng/mL (ref 16–232)
Iron: 54 ug/dL (ref 45–160)
TIBC: 354 mcg/dL (calc) (ref 250–450)

## 2022-09-11 LAB — HIV ANTIBODY (ROUTINE TESTING W REFLEX): HIV 1&2 Ab, 4th Generation: NONREACTIVE

## 2022-09-11 LAB — HEPATITIS C ANTIBODY: Hepatitis C Ab: NONREACTIVE

## 2022-09-14 DIAGNOSIS — E1169 Type 2 diabetes mellitus with other specified complication: Secondary | ICD-10-CM | POA: Insufficient documentation

## 2022-09-14 DIAGNOSIS — E118 Type 2 diabetes mellitus with unspecified complications: Secondary | ICD-10-CM | POA: Insufficient documentation

## 2022-09-14 DIAGNOSIS — D509 Iron deficiency anemia, unspecified: Secondary | ICD-10-CM | POA: Insufficient documentation

## 2022-09-14 DIAGNOSIS — Z1159 Encounter for screening for other viral diseases: Secondary | ICD-10-CM | POA: Insufficient documentation

## 2022-09-14 DIAGNOSIS — E785 Hyperlipidemia, unspecified: Secondary | ICD-10-CM | POA: Insufficient documentation

## 2022-09-14 DIAGNOSIS — Z114 Encounter for screening for human immunodeficiency virus [HIV]: Secondary | ICD-10-CM | POA: Insufficient documentation

## 2022-09-14 DIAGNOSIS — Z1231 Encounter for screening mammogram for malignant neoplasm of breast: Secondary | ICD-10-CM | POA: Insufficient documentation

## 2022-09-15 ENCOUNTER — Encounter: Payer: Self-pay | Admitting: Family Medicine

## 2022-09-15 NOTE — Assessment & Plan Note (Signed)
Chronic.  Recent LDL at goal On statin therapy and tolerating well.  Denies any myalgias. Refill Pravachol 20 mg daily Check fasting lipids

## 2022-09-15 NOTE — Assessment & Plan Note (Signed)
Chronic.  Stable.  Recent A1c 5.9. Tolerating GLP-1 weekly. Refill Ozempic 0.5 mg weekly Continue statin Blood pressure well-controlled. Recommend annual diabetic eye exam Recommend annual foot exam Repeat A1c in 6 months.

## 2022-12-04 ENCOUNTER — Other Ambulatory Visit: Payer: Self-pay

## 2022-12-04 MED ORDER — EPINEPHRINE 0.3 MG/0.3ML IJ SOAJ: 0.3000 mg | INTRAMUSCULAR | 2 refills | Status: DC | PRN

## 2022-12-06 ENCOUNTER — Encounter: Payer: Self-pay | Admitting: Family Medicine

## 2022-12-08 ENCOUNTER — Other Ambulatory Visit: Payer: Self-pay | Admitting: Family Medicine

## 2022-12-08 DIAGNOSIS — L658 Other specified nonscarring hair loss: Secondary | ICD-10-CM

## 2022-12-08 MED ORDER — CLOBETASOL PROPIONATE 0.05 % EX SOLN: Freq: Two times a day (BID) | CUTANEOUS | 11 refills | Status: DC

## 2022-12-08 NOTE — Telephone Encounter (Signed)
Ok to refill the clobetasol solution for her?

## 2022-12-10 ENCOUNTER — Telehealth: Payer: Self-pay | Admitting: Family

## 2022-12-10 MED ORDER — EPINEPHRINE 0.3 MG/0.3ML IJ SOAJ: 0.3000 mg | INTRAMUSCULAR | 3 refills | Status: AC | PRN

## 2022-12-10 NOTE — Telephone Encounter (Signed)
Pt originally asked for refills to be sent to [facility] but it was sent to local pharmacy. It will not let me reorder the Clobetasol solution to [facility].

## 2022-12-16 ENCOUNTER — Ambulatory Visit: Admission: RE | Admit: 2022-12-16 | Payer: BC Managed Care – PPO | Source: Ambulatory Visit

## 2022-12-16 DIAGNOSIS — Z1231 Encounter for screening mammogram for malignant neoplasm of breast: Secondary | ICD-10-CM

## 2022-12-18 NOTE — Telephone Encounter (Signed)
Prescription Request  12/18/2022  LOV: Visit date not found  What is the name of the medication or equipment? EPINEPHrine (EPIPEN 2-PAK) 0.3 mg/0.3 mL IJ SOAJ injection  Have you contacted your pharmacy to request a refill? Yes   Which pharmacy would you like this sent to?   [facility] [address] [place] - [address] [place] [address] Phone: [phone] Fax: [phone]   Patient notified that their request is being sent to the clinical staff for review and that they should receive a response within 2 business days.   Please advise at Mobile [phone] (mobile)   Pt stated she will like a call back when this refill its done. She been waiting for this.

## 2022-12-22 NOTE — Telephone Encounter (Signed)
Called pharmacy and they stated they would go ahead and fill the medication.

## 2023-03-16 ENCOUNTER — Encounter: Payer: Self-pay | Admitting: Family Medicine

## 2023-03-16 ENCOUNTER — Ambulatory Visit: Payer: BC Managed Care – PPO | Admitting: Family Medicine

## 2023-03-16 VITALS — BP 132/76 | HR 56 | Temp 98.0°F | Resp 16 | Ht 64.5 in | Wt 189.1 lb

## 2023-03-16 DIAGNOSIS — Z83719 Family history of colon polyps, unspecified: Secondary | ICD-10-CM

## 2023-03-16 DIAGNOSIS — S82851A Displaced trimalleolar fracture of right lower leg, initial encounter for closed fracture: Secondary | ICD-10-CM

## 2023-03-16 DIAGNOSIS — E118 Type 2 diabetes mellitus with unspecified complications: Secondary | ICD-10-CM | POA: Diagnosis not present

## 2023-03-16 DIAGNOSIS — E1169 Type 2 diabetes mellitus with other specified complication: Secondary | ICD-10-CM

## 2023-03-16 DIAGNOSIS — E119 Type 2 diabetes mellitus without complications: Secondary | ICD-10-CM

## 2023-03-16 DIAGNOSIS — E559 Vitamin D deficiency, unspecified: Secondary | ICD-10-CM

## 2023-03-16 DIAGNOSIS — Z23 Encounter for immunization: Secondary | ICD-10-CM | POA: Diagnosis not present

## 2023-03-16 DIAGNOSIS — Z0100 Encounter for examination of eyes and vision without abnormal findings: Secondary | ICD-10-CM | POA: Diagnosis not present

## 2023-03-16 DIAGNOSIS — E785 Hyperlipidemia, unspecified: Secondary | ICD-10-CM

## 2023-03-16 DIAGNOSIS — Z7985 Long-term (current) use of injectable non-insulin antidiabetic drugs: Secondary | ICD-10-CM

## 2023-03-16 LAB — MICROALBUMIN / CREATININE URINE RATIO
Creatinine,U: 146.3 mg/dL
Microalb Creat Ratio: 0.8 mg/g (ref 0.0–30.0)
Microalb, Ur: 1.2 mg/dL (ref 0.0–1.9)

## 2023-03-16 LAB — VITAMIN D 25 HYDROXY (VIT D DEFICIENCY, FRACTURES): VITD: 24.55 ng/mL — ABNORMAL LOW (ref 30.00–100.00)

## 2023-03-16 LAB — HEMOGLOBIN A1C: Hgb A1c MFr Bld: 6.1 % (ref 4.6–6.5)

## 2023-03-16 NOTE — Assessment & Plan Note (Signed)
Scheduled for eye exam tomorrow at at [facility].  -Request records be sent to clinic when completed

## 2023-03-16 NOTE — Assessment & Plan Note (Signed)
Check Vitamin D level 

## 2023-03-16 NOTE — Progress Notes (Unsigned)
SUBJECTIVE:   Chief Complaint  Patient presents with  . Diabetes   HPI Presents for follow up diabetes management  Discussed the use of AI scribe software for clinical note transcription with the patient, who gave verbal consent to proceed.  History of Present Illness The patient, with a history of diabetes, presents for a six-month follow-up. They report overall good health and have been focusing on recovery from a recent injury. They have been maintaining physical activity and adhering to their medication regimen, including Ozempic 0.5mg  weekly, which they tolerate well. They express curiosity about increasing the dosage of Ozempic to aid in weight loss, as they have noticed a plateau in their weight loss progress and an increase in their food intake.  The patient also reports a family history of polyps, with their mother having had polyps removed during each colonoscopy. They have had a colonoscopy themselves with no polyps found, but they are concerned about whether they need to be screened more frequently due to their family history.  In addition to their diabetes management, the patient has been managing their cholesterol with Pravachol, and their blood pressure readings at home are typically in the 120s over 70s range. They deny any chest pain or shortness of breath.  The patient also mentions a recent injury, but details about the nature and severity of this injury are not provided in the conversation. They express a desire to return to their usual routine and increase their physical activity.  Lastly, the patient requests a handicap form due to balance issues and difficulty with inclines related to their recent injury. They express a desire to have this available for when the weather is bad or when they need to park on the ground level at work.   PERTINENT PMH / PSH: As above  OBJECTIVE:  BP 132/76   Pulse (!) 56   Temp 98 F (36.7 C)   Resp 16   Ht 5' 4.5" (1.638 m)   Wt  189 lb 2 oz (85.8 kg)   LMP  (LMP Unknown)   SpO2 100%   BMI 31.96 kg/m    Physical Exam Vitals reviewed.  Constitutional:      General: She is not in acute distress.    Appearance: Normal appearance. She is normal weight. She is not ill-appearing, toxic-appearing or diaphoretic.  HENT:     Right Ear: Tympanic membrane, ear canal and external ear normal.     Left Ear: Tympanic membrane, ear canal and external ear normal.  Eyes:     General:        Right eye: No discharge.        Left eye: No discharge.     Conjunctiva/sclera: Conjunctivae normal.  Cardiovascular:     Rate and Rhythm: Normal rate and regular rhythm.     Heart sounds: Normal heart sounds.  Pulmonary:     Effort: Pulmonary effort is normal.     Breath sounds: Normal breath sounds.  Abdominal:     General: Bowel sounds are normal.  Musculoskeletal:        General: Normal range of motion.  Skin:    General: Skin is warm and dry.  Neurological:     General: No focal deficit present.     Mental Status: She is alert and oriented to person, place, and time. Mental status is at baseline.  Psychiatric:        Mood and Affect: Mood normal.        Behavior: Behavior  normal.        Thought Content: Thought content normal.        Judgment: Judgment normal.       09/10/2022    9:55 AM 03/05/2022    8:16 AM 11/29/2021    2:07 PM 09/03/2021    7:56 AM 02/06/2021    8:36 AM  Depression screen PHQ 2/9  Decreased Interest 0 0 0 0 0  Down, Depressed, Hopeless 0 0 0 0 0  PHQ - 2 Score 0 0 0 0 0  Altered sleeping 0      Tired, decreased energy 0      Change in appetite 0      Feeling bad or failure about yourself  0      Trouble concentrating 0      Moving slowly or fidgety/restless 0      Suicidal thoughts 0      PHQ-9 Score 0      Difficult doing work/chores Not difficult at all          09/10/2022    9:55 AM  GAD 7 : Generalized Anxiety Score  Nervous, Anxious, on Edge 0  Control/stop worrying 0  Worry too  much - different things 0  Trouble relaxing 0  Restless 0  Easily annoyed or irritable 0  Afraid - awful might happen 0  Total GAD 7 Score 0  Anxiety Difficulty Not difficult at all    ASSESSMENT/PLAN:  DM type 2, controlled, with complication (HCC) Assessment & Plan: Controlled on Ozempic 0.5mg  weekly. Discussed potential benefits of increasing dose for further weight loss and A1C reduction, but also the risk of hypoglycemia. -Continue Ozempic 0.5mg  weekly. -Check A1C today to assess control. -Foot exam today -Check urine ACR today.  Orders: -     Microalbumin / creatinine urine ratio -     Hemoglobin A1c  Vitamin D deficiency Assessment & Plan: Check Vitamin D level  Orders: -     VITAMIN D 25 Hydroxy (Vit-D Deficiency, Fractures)  Diabetic eye exam (HCC) Assessment & Plan: Scheduled for eye exam tomorrow at at [facility].  -Request records be sent to clinic when completed  Orders: -     Ambulatory referral to Ophthalmology  Need for influenza vaccination -     Flu vaccine trivalent PF, 6mos and older(Flulaval,Afluria,Fluarix,Fluzone)  Encounter for Prevnar pneumococcal vaccination -     Pneumococcal conjugate vaccine 20-valent  Hyperlipidemia associated with type 2 diabetes mellitus (HCC) Assessment & Plan: LDL in the 80s on Pravastatin. Discussed potential need to increase dose or switch to Crestor for further LDL reduction. -Continue Pravastatin. -Recheck lipid panel today.   Family history of colon polyps, unspecified Assessment & Plan: Patient's mother has history of polyps. Patient's last colonoscopy was in 2020 with no polyps found. Discussed need for potential earlier screening based on mother's history. -Advise patient to inquire about type and number of mother's polyps to determine need for earlier screening.     General Health Maintenance -Administer influenza and pneumococcal vaccines today.  -Advise patient to consider shingles  vaccine.  -Advise patient to consider obtaining handicap form for parking due to balance issues.      PDMP reviewed  Return in about 6 months (around 09/14/2023) for PCP.  Dana Allan, MD

## 2023-03-16 NOTE — Assessment & Plan Note (Addendum)
Controlled on Ozempic 0.5mg  weekly. Discussed potential benefits of increasing dose for further weight loss and A1C reduction, but also the risk of hypoglycemia. -Continue Ozempic 0.5mg  weekly. -Check A1C today to assess control. -Foot exam today -Check urine ACR today.

## 2023-03-16 NOTE — Assessment & Plan Note (Signed)
LDL in the 80s on Pravastatin. Discussed potential need to increase dose or switch to Crestor for further LDL reduction. -Continue Pravastatin. -Recheck lipid panel today.

## 2023-03-16 NOTE — Assessment & Plan Note (Signed)
Patient's mother has history of polyps. Patient's last colonoscopy was in 2020 with no polyps found. Discussed need for potential earlier screening based on mother's history. -Advise patient to inquire about type and number of mother's polyps to determine need for earlier screening.

## 2023-03-16 NOTE — Patient Instructions (Addendum)
It was a pleasure meeting you today. Thank you for allowing me to take part in your health care.  Our goals for today as we discussed include:  We will get some labs today.  If they are abnormal or we need to do something about them, I will call you.  If they are normal, I will send you a message on MyChart (if it is active) or a letter in the mail.  If you don't hear from [place] in 2 weeks, please call the office at the number below.   Foot exam today  Eye exam due.  Please have report sent to clinic  Flu vaccine today  Pneumonia 20 vaccine today.  No further vaccines indicated  DMV forms completed   This is a list of the screening recommended for you and due dates:  Health Maintenance  Topic Date Due   Flu Shot  12/25/2022   COVID-19 Vaccine (5 - 2023-24 season) 01/25/2023   Yearly kidney health urinalysis for diabetes  03/06/2023   Eye exam for diabetics  03/01/2023   Hemoglobin A1C  03/12/2023   Pap with HPV screening  09/04/2023   Yearly kidney function blood test for diabetes  09/10/2023   Complete foot exam   03/15/2024   DTaP/Tdap/Td vaccine (3 - Td or Tdap) 10/25/2024   Mammogram  12/15/2024   Colon Cancer Screening  05/04/2029   Hepatitis C Screening  Completed   HIV Screening  Completed   HPV Vaccine  Aged Out   Zoster (Shingles) Vaccine  Discontinued    Follow up in 6 months  If you have any questions or concerns, please do not hesitate to call the office at [phone].  I look forward to our next visit and until then take care and stay safe.  Regards,   Dana Allan, MD   [facility]

## 2023-03-17 ENCOUNTER — Encounter: Payer: Self-pay | Admitting: Family Medicine

## 2023-03-17 DIAGNOSIS — S82853A Displaced trimalleolar fracture of unspecified lower leg, initial encounter for closed fracture: Secondary | ICD-10-CM | POA: Insufficient documentation

## 2023-03-17 LAB — HM DIABETES EYE EXAM

## 2023-03-17 MED ORDER — VITAMIN D (ERGOCALCIFEROL) 1.25 MG (50000 UNIT) PO CAPS
50000.0000 [IU] | ORAL_CAPSULE | ORAL | 1 refills | Status: DC
Start: 2023-03-17 — End: 2023-09-20

## 2023-03-17 NOTE — Assessment & Plan Note (Addendum)
S/p ORIF.  Follows with Podiatry at [facility]. Requesting DMV placard for 5 years.  No indication for 5 year placard.  Will provide for 6 months. She will need to follow up with Podiatry if requiring longterm use.

## 2023-09-14 ENCOUNTER — Ambulatory Visit: Payer: BC Managed Care – PPO | Admitting: Family Medicine

## 2023-09-14 ENCOUNTER — Encounter: Payer: Self-pay | Admitting: Family Medicine

## 2023-09-14 VITALS — BP 130/70 | HR 61 | Temp 98.4°F | Resp 20 | Ht 64.5 in | Wt 185.4 lb

## 2023-09-14 DIAGNOSIS — Z23 Encounter for immunization: Secondary | ICD-10-CM | POA: Diagnosis not present

## 2023-09-14 DIAGNOSIS — E118 Type 2 diabetes mellitus with unspecified complications: Secondary | ICD-10-CM | POA: Diagnosis not present

## 2023-09-14 DIAGNOSIS — E1169 Type 2 diabetes mellitus with other specified complication: Secondary | ICD-10-CM

## 2023-09-14 DIAGNOSIS — E538 Deficiency of other specified B group vitamins: Secondary | ICD-10-CM

## 2023-09-14 DIAGNOSIS — J302 Other seasonal allergic rhinitis: Secondary | ICD-10-CM

## 2023-09-14 DIAGNOSIS — E559 Vitamin D deficiency, unspecified: Secondary | ICD-10-CM | POA: Diagnosis not present

## 2023-09-14 DIAGNOSIS — L6681 Central centrifugal cicatricial alopecia: Secondary | ICD-10-CM

## 2023-09-14 DIAGNOSIS — E785 Hyperlipidemia, unspecified: Secondary | ICD-10-CM | POA: Diagnosis not present

## 2023-09-14 DIAGNOSIS — D241 Benign neoplasm of right breast: Secondary | ICD-10-CM | POA: Insufficient documentation

## 2023-09-14 DIAGNOSIS — Z7985 Long-term (current) use of injectable non-insulin antidiabetic drugs: Secondary | ICD-10-CM

## 2023-09-14 DIAGNOSIS — K219 Gastro-esophageal reflux disease without esophagitis: Secondary | ICD-10-CM

## 2023-09-14 LAB — COMPREHENSIVE METABOLIC PANEL WITH GFR
ALT: 28 U/L (ref 0–35)
AST: 26 U/L (ref 0–37)
Albumin: 4.5 g/dL (ref 3.5–5.2)
Alkaline Phosphatase: 53 U/L (ref 39–117)
BUN: 10 mg/dL (ref 6–23)
CO2: 28 meq/L (ref 19–32)
Calcium: 9.6 mg/dL (ref 8.4–10.5)
Chloride: 107 meq/L (ref 96–112)
Creatinine, Ser: 0.82 mg/dL (ref 0.40–1.20)
GFR: 79.79 mL/min (ref >60.00)
Glucose, Bld: 80 mg/dL (ref 70–99)
Potassium: 4.1 meq/L (ref 3.5–5.1)
Sodium: 142 meq/L (ref 135–145)
Total Bilirubin: 0.3 mg/dL (ref 0.2–1.2)
Total Protein: 6.9 g/dL (ref 6.0–8.3)

## 2023-09-14 LAB — POCT GLYCOSYLATED HEMOGLOBIN (HGB A1C): Hemoglobin A1C: 5.9 % — AB (ref 4.0–5.6)

## 2023-09-14 LAB — LIPID PANEL
Cholesterol: 165 mg/dL (ref 0–200)
HDL: 48.4 mg/dL (ref >39.00)
LDL Cholesterol: 99 mg/dL (ref 0–99)
NonHDL: 116.72
Total CHOL/HDL Ratio: 3
Triglycerides: 87 mg/dL (ref 0.0–149.0)
VLDL: 17.4 mg/dL (ref 0.0–40.0)

## 2023-09-14 LAB — VITAMIN B12: Vitamin B-12: 255 pg/mL (ref 211–911)

## 2023-09-14 LAB — VITAMIN D 25 HYDROXY (VIT D DEFICIENCY, FRACTURES): VITD: 29.31 ng/mL — ABNORMAL LOW (ref 30.00–100.00)

## 2023-09-14 MED ORDER — PRAVASTATIN SODIUM 20 MG PO TABS
20.0000 mg | ORAL_TABLET | Freq: Every day | ORAL | 3 refills | Status: AC
Start: 2023-09-14 — End: ?

## 2023-09-14 MED ORDER — CLOBETASOL PROPIONATE 0.05 % EX SOLN
Freq: Two times a day (BID) | CUTANEOUS | 11 refills | Status: AC
Start: 2023-09-14 — End: ?

## 2023-09-14 MED ORDER — OMEPRAZOLE 20 MG PO CPDR
20.0000 mg | DELAYED_RELEASE_CAPSULE | ORAL | 3 refills | Status: DC | PRN
Start: 2023-09-14 — End: 2023-09-16

## 2023-09-14 MED ORDER — FLUTICASONE PROPIONATE 50 MCG/ACT NA SUSP
2.0000 | Freq: Every day | NASAL | 11 refills | Status: AC
Start: 2023-09-14 — End: ?

## 2023-09-14 MED ORDER — SEMAGLUTIDE (1 MG/DOSE) 4 MG/3ML ~~LOC~~ SOPN
1.0000 mg | PEN_INJECTOR | SUBCUTANEOUS | 1 refills | Status: DC
Start: 2023-09-14 — End: 2024-01-13

## 2023-09-14 NOTE — Patient Instructions (Addendum)
 It was a pleasure meeting you today. Thank you for allowing me to take part in your health care.  Our goals for today as we discussed include:  We will get some labs today.  If they are abnormal or we need to do something about them, I will call you.  If they are normal, I will send you a message on MyChart (if it is active) or a letter in the mail.  If you don't hear from us  in 2 weeks, please call the office at the number below.   Refills sent for requested medications  Increase Ozempic  to 1mg  weekly  Recommend Shingles vaccine.  This is a 2 dose series. First dose today.  Please schedule nurse clinic appointment for second vaccine in 2 months   This is a list of the screening recommended for you and due dates:  Health Maintenance  Topic Date Due   COVID-19 Vaccine (7 - 2024-25 season) 01/25/2023   Pap with HPV screening  09/04/2023   Yearly kidney function blood test for diabetes  09/10/2023   Mammogram  12/16/2023   Flu Shot  12/25/2023   Yearly kidney health urinalysis for diabetes  03/15/2024   Complete foot exam   03/15/2024   Hemoglobin A1C  03/15/2024   Eye exam for diabetics  03/16/2024   DTaP/Tdap/Td vaccine (5 - Td or Tdap) 11/12/2024   Colon Cancer Screening  05/04/2029   Pneumococcal Vaccination  Completed   Hepatitis C Screening  Completed   HIV Screening  Completed   HPV Vaccine  Aged Out   Meningitis B Vaccine  Aged Out   Zoster (Shingles) Vaccine  Discontinued      If you have any questions or concerns, please do not hesitate to call the office at [phone].  I look forward to our next visit and until then take care and stay safe.  Regards,   Valli Gaw, MD   [facility]

## 2023-09-14 NOTE — Progress Notes (Signed)
 SUBJECTIVE:   Chief Complaint  Patient presents with   Diabetes    6 month follow up   HPI Presents for follow up chronic disease management  Discussed the use of AI scribe software for clinical note transcription with the patient, who gave verbal consent to proceed.  History of Present Illness Sara Mercer is a 58 year old female with diabetes who presents for a follow-up visit.  She has not had recent blood work and is fasting today for labs. She is on Ozempic  0.5 mg weekly, which has decreased her A1c. She intermittently checks her blood sugars at home, with readings ranging from 70 to 104 mg/dL, typically in the 40J and 90s. Her weight is stable, but she is interested in further weight loss. She is inconsistent with taking her vitamin D  supplements daily.  She experiences loose stools since returning from a trip to [place], with no associated stomach pain or nausea. The loose stools are not related to specific meals and occur intermittently. No constipation or abdominal pain. Her colonoscopy was normal, and she does not believe the symptoms are related to Ozempic  as they predate its use.  She requests medication refills for Ozempic , Pravachol , Prilosec, Flonase , and clobetasol . She does not need refills for vitamin D  as she has two refills remaining. She prefers to receive her medications through [facility] pharmacy for cost savings.    PERTINENT PMH / PSH: As above   OBJECTIVE:  BP 130/70   Pulse 61   Temp 98.4 F (36.9 C)   Resp 20   Ht 5' 4.5" (1.638 m)   Wt 185 lb 6 oz (84.1 kg)   LMP  (LMP Unknown)   SpO2 99%   BMI 31.33 kg/m    Physical Exam Vitals reviewed.  Constitutional:      General: She is not in acute distress.    Appearance: Normal appearance. She is obese. She is not ill-appearing, toxic-appearing or diaphoretic.  Eyes:     General:        Right eye: No discharge.        Left eye: No discharge.     Conjunctiva/sclera: Conjunctivae normal.   Neck:     Thyroid : No thyromegaly or thyroid  tenderness.  Cardiovascular:     Rate and Rhythm: Normal rate and regular rhythm.     Heart sounds: Normal heart sounds.  Pulmonary:     Effort: Pulmonary effort is normal.     Breath sounds: Normal breath sounds.  Abdominal:     General: Bowel sounds are normal.  Musculoskeletal:        General: Normal range of motion.  Skin:    General: Skin is warm and dry.  Neurological:     General: No focal deficit present.     Mental Status: She is alert and oriented to person, place, and time. Mental status is at baseline.  Psychiatric:        Mood and Affect: Mood normal.        Behavior: Behavior normal.        Thought Content: Thought content normal.        Judgment: Judgment normal.           09/14/2023    8:18 AM 09/10/2022    9:55 AM 03/05/2022    8:16 AM 11/29/2021    2:07 PM 09/03/2021    7:56 AM  Depression screen PHQ 2/9  Decreased Interest 0 0 0 0 0  Down, Depressed, Hopeless 0 0 0  0 0  PHQ - 2 Score 0 0 0 0 0  Altered sleeping 0 0     Tired, decreased energy 0 0     Change in appetite 0 0     Feeling bad or failure about yourself  0 0     Trouble concentrating 0 0     Moving slowly or fidgety/restless 0 0     Suicidal thoughts 0 0     PHQ-9 Score 0 0     Difficult doing work/chores Not difficult at all Not difficult at all         09/14/2023    8:18 AM 09/10/2022    9:55 AM  GAD 7 : Generalized Anxiety Score  Nervous, Anxious, on Edge 0 0  Control/stop worrying 0 0  Worry too much - different things 0 0  Trouble relaxing 0 0  Restless 0 0  Easily annoyed or irritable 0 0  Afraid - awful might happen 0 0  Total GAD 7 Score 0 0  Anxiety Difficulty Not difficult at all Not difficult at all    ASSESSMENT/PLAN:  DM type 2, controlled, with complication (HCC) Assessment & Plan: On Ozempic  0.5 mg weekly with reduced A1c. Interested in increasing to 1 mg for weight and glycemic control. No hypoglycemia, glucose  70-104 mg/dL. Emphasized dietary changes, protein before carbs. - A1c today 5.9 - Increase Ozempic  to 1 mg weekly. - Order fasting blood work including lipids. - Educated on dietary modifications, emphasizing protein intake before carbohydrates.  Orders: -     POCT glycosylated hemoglobin (Hb A1C) -     Comprehensive metabolic panel with GFR -     Semaglutide  (1 MG/DOSE); Inject 1 mg as directed once a week.  Dispense: 3 mL; Refill: 1  Hyperlipidemia associated with type 2 diabetes mellitus (HCC) Assessment & Plan: On Pravachol . Fasting lipid panel ordered to assess and adjust treatment. Emphasized medication adherence. - Order fasting lipid panel. - Refill Pravachol  prescription.    Orders: -     Pravastatin  Sodium; Take 1 tablet (20 mg total) by mouth daily. After 6 pm  Dispense: 90 tablet; Refill: 3 -     Lipid panel  Seasonal allergies -     Fluticasone  Propionate; Place 2 sprays into both nostrils daily.  Dispense: 16 g; Refill: 11  Vitamin D  deficiency -     VITAMIN D  25 Hydroxy (Vit-D Deficiency, Fractures) -     Vitamin D  (Ergocalciferol ); Take 1 capsule (50,000 Units total) by mouth every 7 (seven) days.  Dispense: 12 capsule; Refill: 1  Vitamin B 12 deficiency -     Vitamin B12  Encounter for immunization -     Varicella-zoster vaccine IM  Central centrifugal scarring alopecia -     Clobetasol  Propionate; Apply topically 2 (two) times daily. For scalp  Dispense: 50 mL; Refill: 11      PDMP reviewed  Return if symptoms worsen or fail to improve, for PCP.  Valli Gaw, MD

## 2023-09-16 ENCOUNTER — Other Ambulatory Visit: Payer: Self-pay | Admitting: Family Medicine

## 2023-09-16 ENCOUNTER — Telehealth: Payer: Self-pay

## 2023-09-16 DIAGNOSIS — K219 Gastro-esophageal reflux disease without esophagitis: Secondary | ICD-10-CM

## 2023-09-16 MED ORDER — OMEPRAZOLE 20 MG PO CPDR
20.0000 mg | DELAYED_RELEASE_CAPSULE | Freq: Every day | ORAL | 3 refills | Status: AC | PRN
Start: 2023-09-16 — End: ?

## 2023-09-16 NOTE — Telephone Encounter (Signed)
 Copied from CRM [id number]. Topic: Clinical - Medication Question >> Sep 16, 2023  1:51 PM Sara Mercer wrote: Reason for CRM: [facility] called regarding omeprazole  (PRILOSEC) 20 MG capsule . State the direction is unclear. Needs maximum daily dose per day. Please call [phone]

## 2023-09-20 ENCOUNTER — Encounter: Payer: Self-pay | Admitting: Family Medicine

## 2023-09-20 DIAGNOSIS — E538 Deficiency of other specified B group vitamins: Secondary | ICD-10-CM | POA: Insufficient documentation

## 2023-09-20 DIAGNOSIS — Z23 Encounter for immunization: Secondary | ICD-10-CM | POA: Insufficient documentation

## 2023-09-20 MED ORDER — VITAMIN D (ERGOCALCIFEROL) 1.25 MG (50000 UNIT) PO CAPS
50000.0000 [IU] | ORAL_CAPSULE | ORAL | 1 refills | Status: AC
Start: 2023-09-20 — End: ?

## 2023-09-20 NOTE — Assessment & Plan Note (Addendum)
 On Ozempic  0.5 mg weekly with reduced A1c. Interested in increasing to 1 mg for weight and glycemic control. No hypoglycemia, glucose 70-104 mg/dL. Emphasized dietary changes, protein before carbs. - A1c today 5.9 - Increase Ozempic  to 1 mg weekly. - Order fasting blood work including lipids. - Educated on dietary modifications, emphasizing protein intake before carbohydrates.

## 2023-09-20 NOTE — Assessment & Plan Note (Signed)
 On Pravachol . Fasting lipid panel ordered to assess and adjust treatment. Emphasized medication adherence. - Order fasting lipid panel. - Refill Pravachol  prescription.

## 2023-09-22 ENCOUNTER — Telehealth: Payer: Self-pay

## 2023-09-22 NOTE — Telephone Encounter (Signed)
 Left message to return call to our office.  Okay to relay message to pt. Please document when pt is spoke to.

## 2023-09-22 NOTE — Telephone Encounter (Signed)
-----   Message from Valli Gaw sent at 09/20/2023  2:22 PM EDT ----- Vitamin D  remains low.  Continue Vitamin D  1.25 mg weekly for 6 months then take over the counter Vitamin D  1000iu daily.  LDL (bad) cholesterol was 99 which is also elevated (nml is <70).   You can decrease these numbers by: -limiting your intake of processed foods and foods high in saturated fats.  -increasing your physical activity -increasing your intake of vegetables, if not allergic, healthy fish (bluefin tuna, wild salmon, and sardines), whole grains and fiber rich foods .   -You can also use extra virgin olive oil instead of butter. -If you smoke, quitting can decrease LDL cholesterol and raise HDL cholesterol.  Recommend starting Crestor 5 mg daily.  If agreeable will send in prescription.  All other blood work acceptable

## 2023-09-25 NOTE — Telephone Encounter (Signed)
 Copied from CRM [id number]. Topic: Clinical - Lab/Test Results >> Sep 25, 2023 12:57 PM Ovid Blow wrote: Reason for CRM: Patient missed call for lab results. Informed patient of lab results and notes left from Dr. Sueanne Emerald. Patient is aware of results and stated that she will start taking her own medication as she should. Patient does not want Crestor sent in.

## 2023-11-04 ENCOUNTER — Other Ambulatory Visit: Payer: Self-pay

## 2023-11-23 ENCOUNTER — Other Ambulatory Visit: Payer: Self-pay | Admitting: Family Medicine

## 2023-11-23 DIAGNOSIS — Z1231 Encounter for screening mammogram for malignant neoplasm of breast: Secondary | ICD-10-CM

## 2023-12-18 ENCOUNTER — Other Ambulatory Visit: Payer: Self-pay | Admitting: Obstetrics and Gynecology

## 2023-12-18 ENCOUNTER — Ambulatory Visit
Admission: RE | Admit: 2023-12-18 | Discharge: 2023-12-18 | Disposition: A | Discharge status: Home / Self Care | Source: Ambulatory Visit

## 2023-12-18 DIAGNOSIS — Z1231 Encounter for screening mammogram for malignant neoplasm of breast: Secondary | ICD-10-CM

## 2023-12-23 ENCOUNTER — Ambulatory Visit: Payer: Self-pay | Admitting: Nurse Practitioner

## 2024-01-12 ENCOUNTER — Telehealth: Payer: Self-pay

## 2024-01-12 DIAGNOSIS — E118 Type 2 diabetes mellitus with unspecified complications: Secondary | ICD-10-CM

## 2024-01-12 NOTE — Telephone Encounter (Signed)
 Prescription Request  01/12/2024  LOV: Visit date not found  What is the name of the medication or equipment? Semaglutide , 1 MG/DOSE, 4 MG/3ML SOPN  Have you contacted your pharmacy to request a refill? No   Which pharmacy would you like this sent to?   [facility] - [place], [place] - [address] [facility] AT [facility] [address] [place] [address] Phone: [phone] Fax: [phone]    Patient notified that their request is being sent to the clinical staff for review and that they should receive a response within 2 business days.   Please advise at Mobile [phone] (mobile)

## 2024-01-13 MED ORDER — SEMAGLUTIDE (1 MG/DOSE) 4 MG/3ML ~~LOC~~ SOPN
1.0000 mg | PEN_INJECTOR | SUBCUTANEOUS | 0 refills | Status: DC
Start: 2024-01-13 — End: 2024-03-14

## 2024-01-13 NOTE — Addendum Note (Signed)
 Addended by: Haizel Gatchell on: 01/13/2024 11:39 AM   Modules accepted: Orders

## 2024-01-13 NOTE — Telephone Encounter (Signed)
 Refill sent.

## 2024-02-05 ENCOUNTER — Encounter: Payer: Self-pay | Admitting: Nurse Practitioner

## 2024-02-05 ENCOUNTER — Ambulatory Visit: Admitting: Nurse Practitioner

## 2024-02-05 VITALS — BP 116/76 | HR 62 | Temp 98.4°F | Ht 64.5 in | Wt 180.8 lb

## 2024-02-05 DIAGNOSIS — E785 Hyperlipidemia, unspecified: Secondary | ICD-10-CM

## 2024-02-05 DIAGNOSIS — Z23 Encounter for immunization: Secondary | ICD-10-CM

## 2024-02-05 DIAGNOSIS — E559 Vitamin D deficiency, unspecified: Secondary | ICD-10-CM

## 2024-02-05 DIAGNOSIS — E118 Type 2 diabetes mellitus with unspecified complications: Secondary | ICD-10-CM

## 2024-02-05 DIAGNOSIS — K219 Gastro-esophageal reflux disease without esophagitis: Secondary | ICD-10-CM | POA: Diagnosis not present

## 2024-02-05 DIAGNOSIS — E1169 Type 2 diabetes mellitus with other specified complication: Secondary | ICD-10-CM | POA: Diagnosis not present

## 2024-02-05 LAB — LIPID PANEL
Cholesterol: 164 mg/dL (ref 0–200)
HDL: 46.3 mg/dL (ref >39.00)
LDL Cholesterol: 103 mg/dL — ABNORMAL HIGH (ref 0–99)
NonHDL: 118.01
Total CHOL/HDL Ratio: 4
Triglycerides: 75 mg/dL (ref 0.0–149.0)
VLDL: 15 mg/dL (ref 0.0–40.0)

## 2024-02-05 LAB — MICROALBUMIN / CREATININE URINE RATIO
Creatinine,U: 264.1 mg/dL
Microalb Creat Ratio: 8.5 mg/g (ref 0.0–30.0)
Microalb, Ur: 2.2 mg/dL — ABNORMAL HIGH (ref 0.0–1.9)

## 2024-02-05 LAB — COMPREHENSIVE METABOLIC PANEL WITH GFR
ALT: 31 U/L (ref 0–35)
AST: 31 U/L (ref 0–37)
Albumin: 4.4 g/dL (ref 3.5–5.2)
Alkaline Phosphatase: 50 U/L (ref 39–117)
BUN: 12 mg/dL (ref 6–23)
CO2: 27 meq/L (ref 19–32)
Calcium: 9.6 mg/dL (ref 8.4–10.5)
Chloride: 106 meq/L (ref 96–112)
Creatinine, Ser: 0.9 mg/dL (ref 0.40–1.20)
GFR: 71.16 mL/min (ref >60.00)
Glucose, Bld: 78 mg/dL (ref 70–99)
Potassium: 4 meq/L (ref 3.5–5.1)
Sodium: 142 meq/L (ref 135–145)
Total Bilirubin: 0.4 mg/dL (ref 0.2–1.2)
Total Protein: 7 g/dL (ref 6.0–8.3)

## 2024-02-05 LAB — HEMOGLOBIN A1C: Hgb A1c MFr Bld: 6.5 % (ref 4.6–6.5)

## 2024-02-05 LAB — VITAMIN D 25 HYDROXY (VIT D DEFICIENCY, FRACTURES): VITD: 34.17 ng/mL (ref 30.00–100.00)

## 2024-02-05 MED ORDER — COVID-19 MRNA VACC (MODERNA) 50 MCG/0.5ML IM SUSP
0.5000 mL | Freq: Once | INTRAMUSCULAR | 0 refills | Status: AC
Start: 2024-02-05 — End: 2024-02-05

## 2024-02-05 NOTE — Patient Instructions (Signed)
Influenza (Flu) Vaccine (Inactivated or Recombinant): What You Need to Know Many vaccine information statements are available in Spanish and other languages. See PromoAge.com.br. 1. Why get vaccinated? Influenza vaccine can prevent influenza (flu). Flu is a contagious disease that spreads around the [place] every year, usually between October and May. Anyone can get the flu, but it is more dangerous for some people. Infants and young children, people 58 years and older, pregnant people, and people with certain health conditions or a weakened immune system are at greatest risk of flu complications. Pneumonia, bronchitis, sinus infections, and ear infections are examples of flu-related complications. If you have a medical condition, such as heart disease, cancer, or diabetes, flu can make it worse. Flu can cause fever and chills, sore throat, muscle aches, fatigue, cough, headache, and runny or stuffy nose. Some people may have vomiting and diarrhea, though this is more common in children than adults. In an average year, thousands of people in the [place] die from flu, and many more are hospitalized. Flu vaccine prevents millions of illnesses and flu-related visits to the doctor each year. 2. Influenza vaccines CDC recommends everyone 6 months and older get vaccinated every flu season. Children 6 months through 51 years of age may need 2 doses during a single flu season. Everyone else needs only 1 dose each flu season. It takes about 2 weeks for protection to develop after vaccination. There are many flu viruses, and they are always changing. Each year a new flu vaccine is made to protect against the influenza viruses believed to be likely to cause disease in the upcoming flu season. Even when the vaccine doesn't exactly match these viruses, it may still provide some protection. Influenza vaccine does not cause flu. Influenza vaccine may be given at the same time as other vaccines. 3.  Talk with your health care provider Tell your vaccination provider if the person getting the vaccine: Has had an allergic reaction after a previous dose of influenza vaccine, or has any severe, life-threatening allergies Has ever had Guillain-Barr Syndrome (also called "GBS") In some cases, your health care provider may decide to postpone influenza vaccination until a future visit. Influenza vaccine can be administered at any time during pregnancy. People who are or will be pregnant during influenza season should receive inactivated influenza vaccine. People with minor illnesses, such as a cold, may be vaccinated. People who are moderately or severely ill should usually wait until they recover before getting influenza vaccine. Your health care provider can give you more information. 4. Risks of a vaccine reaction Soreness, redness, and swelling where the shot is given, fever, muscle aches, and headache can happen after influenza vaccination. There may be a very small increased risk of Guillain-Barr Syndrome (GBS) after inactivated influenza vaccine (the flu shot). Young children who get the flu shot along with pneumococcal vaccine (PCV13) and/or DTaP vaccine at the same time might be slightly more likely to have a seizure caused by fever. Tell your health care provider if a child who is getting flu vaccine has ever had a seizure. People sometimes faint after medical procedures, including vaccination. Tell your provider if you feel dizzy or have vision changes or ringing in the ears. As with any medicine, there is a very remote chance of a vaccine causing a severe allergic reaction, other serious injury, or death. 5. What if there is a serious problem? An allergic reaction could occur after the vaccinated person leaves the clinic. If you see signs of  a severe allergic reaction (hives, swelling of the face and throat, difficulty breathing, a fast heartbeat, dizziness, or weakness), call 9-1-1 and get  the person to the nearest hospital. For other signs that concern you, call your health care provider. Adverse reactions should be reported to the Vaccine Adverse Event Reporting System (VAERS). Your health care provider will usually file this report, or you can do it yourself. Visit the VAERS website at www.vaers.LAgents.no or call (774)365-3656. VAERS is only for reporting reactions, and VAERS staff members do not give medical advice. 6. The National Vaccine Injury Compensation Program The [employer] Vaccine Injury Compensation Program (VICP) is a federal program that was created to compensate people who may have been injured by certain vaccines. Claims regarding alleged injury or death due to vaccination have a time limit for filing, which may be as short as two years. Visit the VICP website at SpiritualWord.at or call 3187036210 to learn about the program and about filing a claim. 7. How can I learn more? Ask your health care provider. Call your local or state health department. Visit the website of the Food and Drug Administration (FDA) for vaccine package inserts and additional information at FinderList.no. Contact the Centers for Disease Control and Prevention (CDC): Call 787-694-5930 (1-800-CDC-INFO) or Visit CDC's website at BiotechRoom.com.cy. Source: CDC Vaccine Information Statement Inactivated Influenza Vaccine (12/30/2019) This same material is available at FootballExhibition.com.br for no charge. This information is not intended to replace advice given to you by your health care provider. Make sure you discuss any questions you have with your health care provider. Document Revised: 08/27/2022 Document Reviewed: 06/02/2022 Elsevier Patient Education  2024 [employer].

## 2024-02-05 NOTE — Progress Notes (Signed)
 Leron Glance, NP-C Phone: [phone]  Sara Mercer is a 58 y.o. female who presents today for transfer of care.   Discussed the use of AI scribe software for clinical note transcription with the patient, who gave verbal consent to proceed.  History of Present Illness   Sara Mercer is a 58 year old female who presents for transfer of care.   She is experiencing issues with her EpiPen  prescription coverage as [facility] does not cover the current brand. She does not require an EpiPen  at the moment.  She has diabetes and is currently on Ozempic  (semaglutide ) 1 mg weekly, having recently transitioned from a 0.5 mg dose last week. Her bowel movements have been affected since starting the medication, but it is 'not unmanageable'. She rarely checks her blood sugar, only doing so when she feels unusual in the morning, but reports no episodes of hypoglycemia. Her last A1c in April was 5.9%.  She takes vitamin D  and pravastatin  for hyperlipidemia, with no reported abdominal pain or myalgia. She also uses omeprazole  for gastroesophageal reflux disease on an as-needed basis and is aware of dietary triggers. No dysphagia or hematochezia. She has never undergone an endoscopic examination.  She inquired about her colonoscopy schedule due to her mother's history of polyp removal during colonoscopies, although her mother has not had pre-cancerous adenomas. She has had a recent Pap smear in July.  She has received the first dose of the Shingrix  vaccine and plans to schedule the second dose. She also inquired about receiving a flu vaccine during this visit.      Social History   Tobacco Use  Smoking Status Never  Smokeless Tobacco Never    Current Outpatient Medications on File Prior to Visit  Medication Sig Dispense Refill   clobetasol  (TEMOVATE ) 0.05 % external solution Apply topically 2 (two) times daily. For scalp 50 mL 11   fluticasone  (FLONASE ) 50 MCG/ACT nasal spray Place 2 sprays into  both nostrils daily. 16 g 11   ibuprofen (ADVIL,MOTRIN) 200 MG tablet Take 800 mg by mouth as needed.     Multiple Vitamin (MULTIVITAMIN) tablet Take 1 tablet by mouth daily.     omeprazole  (PRILOSEC) 20 MG capsule Take 1 capsule (20 mg total) by mouth daily as needed. 90 capsule 3   pravastatin  (PRAVACHOL ) 20 MG tablet Take 1 tablet (20 mg total) by mouth daily. After 6 pm 90 tablet 3   Semaglutide , 1 MG/DOSE, 4 MG/3ML SOPN Inject 1 mg as directed once a week. 3 mL 0   Vitamin D , Ergocalciferol , (DRISDOL ) 1.25 MG (50000 UNIT) CAPS capsule Take 1 capsule (50,000 Units total) by mouth every 7 (seven) days. 12 capsule 1   EPINEPHrine  (EPIPEN  2-PAK) 0.3 mg/0.3 mL IJ SOAJ injection Inject 0.3 mg into the muscle as needed for anaphylaxis. 1 each 3   No current facility-administered medications on file prior to visit.     ROS see history of present illness  Objective  Physical Exam Vitals:   02/05/24 0826  BP: 116/76  Pulse: 62  Temp: 98.4 F (36.9 C)  SpO2: 98%    BP Readings from Last 3 Encounters:  02/05/24 116/76  09/14/23 130/70  03/16/23 132/76   Wt Readings from Last 3 Encounters:  02/05/24 180 lb 12.8 oz (82 kg)  09/14/23 185 lb 6 oz (84.1 kg)  03/16/23 189 lb 2 oz (85.8 kg)    Physical Exam Constitutional:      General: She is not in acute distress.  Appearance: Normal appearance.  HENT:     Head: Normocephalic.  Cardiovascular:     Rate and Rhythm: Normal rate and regular rhythm.     Heart sounds: Normal heart sounds.  Pulmonary:     Effort: Pulmonary effort is normal.     Breath sounds: Normal breath sounds.  Skin:    General: Skin is warm and dry.  Neurological:     General: No focal deficit present.     Mental Status: She is alert.  Psychiatric:        Mood and Affect: Mood normal.        Behavior: Behavior normal.      Assessment/Plan: Please see individual problem list.  DM type 2, controlled, with complication (HCC) Assessment &  Plan: Diabetes is well-controlled with semaglutide  1 mg weekly. A1c is at 5.9%. No hypoglycemia or excessive thirst/urination. Bowel changes are manageable. Recheck A1c today. Continue semaglutide  1 mg weekly. Encourage healthy diet and regular exercise.  Orders: -     Hemoglobin A1c -     Microalbumin / creatinine urine ratio  Hyperlipidemia associated with type 2 diabetes mellitus (HCC) Assessment & Plan: Hyperlipidemia is managed with pravastatin . No abdominal pain or myalgia. Continue pravastatin .   Orders: -     Lipid panel -     Comprehensive metabolic panel with GFR  Gastroesophageal reflux disease, unspecified whether esophagitis present Assessment & Plan: GERD is managed with omeprazole  as needed. She is aware of dietary triggers. No dysphagia or hematochezia. Continue omeprazole  as needed and dietary modifications.    Vitamin D  deficiency Assessment & Plan: Vitamin D  deficiency is managed with supplementation. Occasional missed doses, but supply is sufficient. Continue.   Orders: -     VITAMIN D  25 Hydroxy (Vit-D Deficiency, Fractures)  Need for COVID-19 vaccine -     COVID-19 mRNA Vacc (Moderna); Inject 0.5 mLs into the muscle once for 1 dose.  Dispense: 0.5 mL; Refill: 0  Need for influenza vaccination -     Flu vaccine trivalent PF, 6mos and older(Flulaval,Afluria,Fluarix,Fluzone)     Return in about 6 months (around 08/04/2024) for Follow up.   Leron Glance, NP-C [facility]

## 2024-02-11 ENCOUNTER — Ambulatory Visit: Payer: Self-pay | Admitting: Nurse Practitioner

## 2024-02-22 NOTE — Assessment & Plan Note (Signed)
 Hyperlipidemia is managed with pravastatin . No abdominal pain or myalgia. Continue pravastatin .

## 2024-02-22 NOTE — Assessment & Plan Note (Signed)
 GERD is managed with omeprazole  as needed. She is aware of dietary triggers. No dysphagia or hematochezia. Continue omeprazole  as needed and dietary modifications.

## 2024-02-22 NOTE — Assessment & Plan Note (Signed)
 Vitamin D  deficiency is managed with supplementation. Occasional missed doses, but supply is sufficient. Continue.

## 2024-02-22 NOTE — Assessment & Plan Note (Signed)
 Diabetes is well-controlled with semaglutide  1 mg weekly. A1c is at 5.9%. No hypoglycemia or excessive thirst/urination. Bowel changes are manageable. Recheck A1c today. Continue semaglutide  1 mg weekly. Encourage healthy diet and regular exercise.

## 2024-03-14 ENCOUNTER — Other Ambulatory Visit: Payer: Self-pay

## 2024-03-14 ENCOUNTER — Encounter: Payer: Self-pay | Admitting: Nurse Practitioner

## 2024-03-14 DIAGNOSIS — E118 Type 2 diabetes mellitus with unspecified complications: Secondary | ICD-10-CM

## 2024-03-14 MED ORDER — SEMAGLUTIDE (1 MG/DOSE) 4 MG/3ML ~~LOC~~ SOPN
1.0000 mg | PEN_INJECTOR | SUBCUTANEOUS | 1 refills | Status: DC
Start: 2024-03-14 — End: 2024-03-14

## 2024-03-14 MED ORDER — SEMAGLUTIDE (1 MG/DOSE) 4 MG/3ML ~~LOC~~ SOPN: 1.0000 mg | PEN_INJECTOR | SUBCUTANEOUS | 1 refills | Status: DC

## 2024-03-16 ENCOUNTER — Encounter: Admitting: Nurse Practitioner

## 2024-04-13 ENCOUNTER — Ambulatory Visit (INDEPENDENT_AMBULATORY_CARE_PROVIDER_SITE_OTHER)

## 2024-04-13 DIAGNOSIS — Z23 Encounter for immunization: Secondary | ICD-10-CM | POA: Diagnosis not present

## 2024-04-13 NOTE — Progress Notes (Signed)
 Patient was administered a Shingles vaccine into her left deltoid. Patient tolerated the Shingles vaccine well.

## 2024-05-31 ENCOUNTER — Encounter: Payer: Self-pay | Admitting: Nurse Practitioner

## 2024-05-31 DIAGNOSIS — E118 Type 2 diabetes mellitus with unspecified complications: Secondary | ICD-10-CM

## 2024-06-01 MED ORDER — SEMAGLUTIDE (1 MG/DOSE) 4 MG/3ML ~~LOC~~ SOPN: 1.0000 mg | PEN_INJECTOR | SUBCUTANEOUS | 1 refills | Status: AC

## 2024-08-12 ENCOUNTER — Ambulatory Visit: Admitting: Nurse Practitioner
